# Patient Record
Sex: Male | Born: 1975
Health system: Southern US, Community
[De-identification: ages and names within clinical notes are randomized; demographics above are authoritative.]

## PROBLEM LIST (undated history)

## (undated) DIAGNOSIS — G4733 Obstructive sleep apnea (adult) (pediatric): Secondary | ICD-10-CM

## (undated) DIAGNOSIS — K219 Gastro-esophageal reflux disease without esophagitis: Secondary | ICD-10-CM

## (undated) HISTORY — PX: GALLBLADDER SURGERY: SHX652

## (undated) HISTORY — DX: Obstructive sleep apnea (adult) (pediatric): G47.33

## (undated) HISTORY — DX: Gastro-esophageal reflux disease without esophagitis: K21.9

---

## 2003-12-02 ENCOUNTER — Other Ambulatory Visit: Payer: Self-pay

## 2005-01-13 ENCOUNTER — Ambulatory Visit: Payer: Self-pay | Admitting: Family Medicine

## 2005-01-19 ENCOUNTER — Ambulatory Visit: Payer: Self-pay

## 2005-02-01 ENCOUNTER — Emergency Department: Payer: Self-pay | Admitting: General Practice

## 2005-10-05 ENCOUNTER — Emergency Department: Payer: Self-pay | Admitting: Emergency Medicine

## 2005-10-05 ENCOUNTER — Other Ambulatory Visit: Payer: Self-pay

## 2006-01-30 ENCOUNTER — Emergency Department: Payer: Self-pay | Admitting: Emergency Medicine

## 2007-01-10 ENCOUNTER — Emergency Department: Payer: Self-pay | Admitting: Emergency Medicine

## 2007-01-30 ENCOUNTER — Emergency Department (HOSPITAL_COMMUNITY): Admission: EM | Admit: 2007-01-30 | Discharge: 2007-01-31 | Payer: Self-pay | Admitting: *Deleted

## 2011-12-14 ENCOUNTER — Ambulatory Visit: Payer: Self-pay

## 2014-12-28 ENCOUNTER — Ambulatory Visit: Payer: Self-pay | Admitting: Internal Medicine

## 2015-03-13 ENCOUNTER — Emergency Department: Payer: Self-pay | Admitting: Emergency Medicine

## 2015-03-20 ENCOUNTER — Encounter
Admit: 2015-03-20 | Disposition: A | Discharge status: Home / Self Care | Payer: Self-pay | Attending: Surgery | Admitting: Surgery

## 2015-03-27 ENCOUNTER — Encounter
Admit: 2015-03-27 | Disposition: A | Discharge status: Home / Self Care | Payer: Self-pay | Attending: Surgery | Admitting: Surgery

## 2015-08-24 ENCOUNTER — Other Ambulatory Visit: Payer: Self-pay | Admitting: Family Medicine

## 2015-09-24 ENCOUNTER — Encounter: Payer: Self-pay | Admitting: Family Medicine

## 2015-09-24 ENCOUNTER — Ambulatory Visit: Payer: Self-pay | Admitting: Family Medicine

## 2015-09-24 ENCOUNTER — Ambulatory Visit (INDEPENDENT_AMBULATORY_CARE_PROVIDER_SITE_OTHER): Payer: BLUE CROSS/BLUE SHIELD | Admitting: Family Medicine

## 2015-09-24 VITALS — BP 132/78 | HR 113 | Temp 98.7°F | Resp 16 | Ht 70.0 in | Wt 277.1 lb

## 2015-09-24 DIAGNOSIS — R9431 Abnormal electrocardiogram [ECG] [EKG]: Secondary | ICD-10-CM

## 2015-09-24 DIAGNOSIS — R0683 Snoring: Secondary | ICD-10-CM

## 2015-09-24 DIAGNOSIS — R55 Syncope and collapse: Secondary | ICD-10-CM

## 2015-09-24 LAB — GLUCOSE, POCT (MANUAL RESULT ENTRY): POC Glucose: 89 mg/dl (ref 70–99)

## 2015-09-24 NOTE — Progress Notes (Signed)
Name: Nathan [name]   MRN: [id number]    DOB: 09/06/1976   Date:09/24/2015       Progress Note  Subjective  Chief Complaint  Chief Complaint  Patient presents with  . Dizziness    pt has been having dizzy spells for a while and passed out on Saturday  . Gastrophageal Reflux    Loss of Consciousness This is a new problem. The current episode started in the past 7 days. Associated symptoms include chest pain (intermittent chest discomfort attributed to 'heartburn' by the pt.), dizziness and headaches. Pertinent negatives include no abdominal pain, fever, malaise/fatigue, nausea, palpitations or vomiting.  Pt. Describes an episode of loss of consciousness last Saturday Am (5 days ago). He was staying a [place] park in a tent, got up to use the restroom, and apparently passed out and collapsed on the ground. He remembers not feeling anything unusual prior to the episode. He remembers waking up and getting upo from the ground.  Snoring Pt.'s wife, who has accompanied him to today's office visit is also concerned about snoring episodes. Pt. Has always snored but his snoring pattern has changed recently according to his wife. When he wakes up, he often tells his wife that he felt as if he didn't sleep. Past Medical History  Diagnosis Date  . GERD (gastroesophageal reflux disease)   . OSA (obstructive sleep apnea)     Past Surgical History  Procedure Laterality Date  . Gallbladder surgery      Family History  Problem Relation Age of Onset  . Cancer Mother   . Stroke Father   . Heart disease Father   . Diabetes Father     Social History   Social History  . Marital Status: Married    Spouse Name: N/A  . Number of Children: N/A  . Years of Education: N/A   Occupational History  . Not on file.   Social History Main Topics  . Smoking status: Not on file  . Smokeless tobacco: Not on file  . Alcohol Use: Not on file  . Drug Use: Not on file  . Sexual Activity: Not on file    Other Topics Concern  . Not on file   Social History Narrative  . No narrative on file     Current outpatient prescriptions:  .  meloxicam (MOBIC) 15 MG tablet, Take 15 mg by mouth daily., Disp: , Rfl:   Not on File   Review of Systems  Constitutional: Negative for fever, chills and malaise/fatigue.  HENT: Negative for ear discharge and ear pain.   Eyes: Positive for blurred vision (complains of occasional blurred vision, ). Negative for double vision.  Respiratory: Negative for cough and shortness of breath.   Cardiovascular: Positive for chest pain (intermittent chest discomfort attributed to 'heartburn' by the pt.) and syncope. Negative for palpitations and leg swelling.  Gastrointestinal: Negative for heartburn, nausea, vomiting, abdominal pain and blood in stool.  Musculoskeletal: Negative for neck pain.  Neurological: Positive for dizziness, loss of consciousness and headaches. Negative for seizures.  Psychiatric/Behavioral: Negative for depression. The patient is not nervous/anxious.    Objective  Filed Vitals:   09/24/15 1543  BP: 132/78  Pulse: 113  Temp: 98.7 F (37.1 C)  Resp: 16  Height: 5\' 10"  (1.778 m)  Weight: 277 lb 2 oz (125.703 kg)  SpO2: 97%    Physical Exam  Constitutional: He is well-developed, well-nourished, and in no distress.  HENT:  Head: Normocephalic and atraumatic.  Right  Ear: Tympanic membrane and ear canal normal.  Left Ear: Tympanic membrane and ear canal normal.  Eyes: Conjunctivae are normal. Pupils are equal, round, and reactive to light.  Neck: Normal range of motion.  Cardiovascular: Normal rate, regular rhythm and normal heart sounds.   Pulmonary/Chest: Effort normal and breath sounds normal.  Abdominal: Soft. There is no tenderness.  Neurological: He is alert. He has normal strength and intact cranial nerves.  Skin: Skin is warm and dry.  Psychiatric: Memory, affect and judgment normal.  Nursing note and vitals  reviewed.  Assessment & Plan  1. Snoring  - Ambulatory referral to Sleep Studies  2. Syncope and collapse Obtain workup including EKG, metabolic studies, complete blood count, MRI of brain, and Dopplers for evaluation of syncope. Follow-up after workup is complete. - POCT Glucose (CBG) - CBC with Differential - Comprehensive Metabolic Panel (CMET) - EKG 12-Lead - MR Brain Wo Contrast; Future - US Carotid Duplex Bilateral; Future  3. Abnormal EKG Referral to cardiology for short PR interval - Ambulatory referral to Cardiology   [facility] 09/24/2015 4:09 PM

## 2015-10-08 ENCOUNTER — Other Ambulatory Visit: Payer: Self-pay | Admitting: Family Medicine

## 2015-10-08 ENCOUNTER — Ambulatory Visit
Admission: RE | Admit: 2015-10-08 | Discharge: 2015-10-08 | Disposition: A | Discharge status: Home / Self Care | Payer: BLUE CROSS/BLUE SHIELD | Source: Ambulatory Visit | Attending: Family Medicine | Admitting: Family Medicine

## 2015-10-08 DIAGNOSIS — T1590XA Foreign body on external eye, part unspecified, unspecified eye, initial encounter: Secondary | ICD-10-CM

## 2015-10-08 DIAGNOSIS — Z87821 Personal history of retained foreign body fully removed: Secondary | ICD-10-CM | POA: Diagnosis not present

## 2015-10-09 ENCOUNTER — Ambulatory Visit
Admission: RE | Admit: 2015-10-09 | Discharge: 2015-10-09 | Disposition: A | Discharge status: Home / Self Care | Payer: BLUE CROSS/BLUE SHIELD | Source: Ambulatory Visit | Attending: Family Medicine | Admitting: Family Medicine

## 2015-10-09 DIAGNOSIS — R55 Syncope and collapse: Secondary | ICD-10-CM | POA: Insufficient documentation

## 2015-10-13 ENCOUNTER — Ambulatory Visit: Payer: BLUE CROSS/BLUE SHIELD | Admitting: Family Medicine

## 2015-11-02 ENCOUNTER — Ambulatory Visit: Payer: BLUE CROSS/BLUE SHIELD | Admitting: Family Medicine

## 2015-11-12 ENCOUNTER — Encounter: Payer: Self-pay | Admitting: Cardiovascular Disease

## 2015-11-12 ENCOUNTER — Ambulatory Visit (INDEPENDENT_AMBULATORY_CARE_PROVIDER_SITE_OTHER): Payer: BLUE CROSS/BLUE SHIELD | Admitting: Cardiovascular Disease

## 2015-11-12 VITALS — BP 147/92 | HR 99 | Ht 70.5 in | Wt 282.5 lb

## 2015-11-12 DIAGNOSIS — G4733 Obstructive sleep apnea (adult) (pediatric): Secondary | ICD-10-CM | POA: Diagnosis not present

## 2015-11-12 DIAGNOSIS — Z9989 Dependence on other enabling machines and devices: Secondary | ICD-10-CM

## 2015-11-12 DIAGNOSIS — R9431 Abnormal electrocardiogram [ECG] [EKG]: Secondary | ICD-10-CM

## 2015-11-12 DIAGNOSIS — R55 Syncope and collapse: Secondary | ICD-10-CM | POA: Diagnosis not present

## 2015-11-12 NOTE — Assessment & Plan Note (Addendum)
Etiology of his syncope is unclear. Possibly had a  orthostatic events climbing off the inflatable bed on the floor of the tent. Symptom occurred after getting off the ground to a standing position. Mild orthostasis seen on today's visit with 20 point drop in his systolic pressure Recommended fluid hydration. EKG is normal, clinical exam otherwise normal Otherwise very active at baseline. Low likelihood of ischemia given minimal intimal thickening on carotid ultrasound He does report episode of tachycardia at home unrelated to his episode We did discuss 30 day monitor. This could be done for recurrent symptoms Given normal clinical exam, symptom free, echocardiogram likely of little clinical benefit that could be done for recurrent symptoms -Unclear if sleep apnea could have been playing a role in his syncope episode. Currently now on CPAP. This was discussed with him and his wife.

## 2015-11-12 NOTE — Assessment & Plan Note (Signed)
EKG from primary care showing short PR interval This does raise the concern of accessory pathway Other EKGs 3 over the years showing no significant abnormality Recommended he call us if he has significant tachycardia episodes, event monitor could be ordered

## 2015-11-12 NOTE — Assessment & Plan Note (Signed)
Significant time spent with him discussing a regimen for weight loss He does not do any exercise, has poor diet, drinks significant volume of ice tea and large volume of potato Recommended a low carbohydrate diet

## 2015-11-12 NOTE — Assessment & Plan Note (Signed)
Tolerating CPAP the wife reports he takes this off at 4 in the morning sometimes Still trying to get used to it

## 2015-11-12 NOTE — Progress Notes (Signed)
[name], male    DOB: [birth year], 39 y.o.   MRN: [id number]  HPI Comments: Nathan [name] is a 39 year old gentleman with history of obesity, bone spurs, who presents for evaluation of abnormal EKG and syncope  Reports that approximately 78 weeks ago, he was sleeping in a [place] that [place]. Got up to go urinate at 2 in the morning, walked outside the tent and felt lightheaded, passed out, woke up on the ground. Does not feel that he really hurt himself in the fall. Does not know how long he was out but reports going back to bed, felt fine the next day. Has not had syncope in the past Declines drinking significantly the night before, did not have any GI distress, no illnesses. Nothing out of the ordinary to precipitate an event but did have urgency to go urinate. Inflatable mattress was on the ground in the tent.  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 without shortness of breath, near syncope or syncope Denies any significant chest pain  So far had MRA that showed no pathology, carotid ultrasound with minimal intimal thickening Planning to do blood work  He does report dramatic weight gain over the past year or 2 Drink significant iced tea, potatoes/fries  EKG on today's visit shows normal sinus rhythm with no significant ST or T-wave changes, rate 99 bpm EKG through primary care is relatively normal, short PR interval Prior EKG from prior years, 2006, 2004 for sensory normal   No Known Allergies    Medication List       This list is accurate as of: 11/12/15  6:00 PM.  Always use your most recent med list.               etodolac 500 MG tablet  Commonly known as:  LODINE  TAKE 1 TABLET (500 MG TOTAL) BY MOUTH 2 (TWO)  TIMES DAILY.     ranitidine 150 MG capsule  Commonly known as:  ZANTAC  Take 150 mg by mouth 2 (two) times daily.         Past Medical History  Diagnosis Date  . GERD (gastroesophageal reflux disease)   . OSA (obstructive sleep apnea)     Past Surgical History  Procedure Laterality Date  . Gallbladder surgery      Social History  reports that he has quit smoking. His smoking use included Cigarettes. He has a 16 pack-year smoking history. He does not have any smokeless tobacco history on file. He reports that he does not drink alcohol or use illicit drugs.  Family History family history includes Cancer in his mother; Diabetes in his father; Heart disease in his father; Stroke in his father.   Review of Systems  Constitutional: Negative.   Respiratory: Negative.   Cardiovascular: Positive for palpitations.  Gastrointestinal: Negative.   Musculoskeletal: Negative.   Neurological: Positive for syncope.  Hematological: Negative.   Psychiatric/Behavioral: Negative.   All other systems reviewed and are negative.   BP 147/92 mmHg  Pulse 99  Ht 5' 10.5" (1.791 m)  Wt 282 lb 8 oz (128.141 kg)  BMI 39.95 kg/m2  Physical Exam  Constitutional: He is oriented to person, place, and time. He appears well-developed and well-nourished.  Obese  HENT:  Head:  Normocephalic.  Nose: Nose normal.  Mouth/Throat: Oropharynx is clear and moist.  Eyes: Conjunctivae are normal. Pupils are equal, round, and reactive to light.  Neck: Normal range of motion. Neck supple. No JVD present.  Cardiovascular: Normal rate, regular rhythm, normal heart sounds and intact distal pulses.  Exam reveals no gallop and no friction rub.   No murmur heard. Pulmonary/Chest: Effort normal and breath sounds normal. No respiratory distress. He has no wheezes. He has no rales. He exhibits no tenderness.  Abdominal: Soft. Bowel sounds are normal. He exhibits no distension. There is no tenderness.   Musculoskeletal: Normal range of motion. He exhibits no edema or tenderness.  Lymphadenopathy:    He has no cervical adenopathy.  Neurological: He is alert and oriented to person, place, and time. Coordination normal.  Skin: Skin is warm and dry. No rash noted. No erythema.  Psychiatric: He has a normal mood and affect. His behavior is normal. Judgment and thought content normal.

## 2015-11-12 NOTE — Patient Instructions (Addendum)
You are doing well. No medication changes were made.  Please call if you have more tachycardia, fast heart rate Call if you have frequent dizzy spells, or any pass out spells  (syncope)  Please call us if you have new issues that need to be addressed before your next appt.   Tachycardia Tachycardia is a faster than normal heartbeat (more than 100 beats per minute). In adults, the heart normally beats between 60 and 100 times a minute. A fast heartbeat may be a normal response to exercise or stress. It does not necessarily mean that something is wrong. However, sometimes when your heart beats too fast it may not be able to pump enough blood to the rest of your body. This can result in chest pain, shortness of breath, dizziness, and even fainting. Nonspecific tachycardia means that the specific cause or pattern of your tachycardia is unknown. CAUSES  Tachycardia may be harmless or it may be due to a more serious underlying cause. Possible causes of tachycardia include:  Exercise or exertion.  Fever.  Pain or injury.  Infection.  Loss of body fluids (dehydration).  Overactive thyroid.  Lack of red blood cells (anemia).  Anxiety and stress.  Alcohol.  Caffeine.  Tobacco products.  Diet pills.  Illegal drugs.  Heart disease. SYMPTOMS  Rapid or irregular heartbeat (palpitations).  Suddenly feeling your heart beating (cardiac awareness).  Dizziness.  Tiredness (fatigue).  Shortness of breath.  Chest pain.  Nausea.  Fainting. DIAGNOSIS  Your caregiver will perform a physical exam and take your medical history. In some cases, a heart specialist (cardiologist) may be consulted. Your caregiver may also order:  Blood tests.  Electrocardiography. This test records the electrical activity of your heart.  A heart monitoring test. TREATMENT  Treatment will depend on the likely cause of your tachycardia. The goal is to treat the underlying cause of your tachycardia.  Treatment methods may include:  Replacement of fluids or blood through an intravenous (IV) tube for moderate to severe dehydration or anemia.  New medicines or changes in your current medicines.  Diet and lifestyle changes.  Treatment for certain infections.  Stress relief or relaxation methods. HOME CARE INSTRUCTIONS   Rest.  Drink enough fluids to keep your urine clear or pale yellow.  Do not smoke.  Avoid:  Caffeine.  Tobacco.  Alcohol.  Chocolate.  Stimulants such as over-the-counter diet pills or pills that help you stay awake.  Situations that cause anxiety or stress.  Illegal drugs such as marijuana, phencyclidine (PCP), and cocaine.  Only take medicine as directed by your caregiver.  Keep all follow-up appointments as directed by your caregiver. SEEK IMMEDIATE MEDICAL CARE IF:   You have pain in your chest, upper arms, jaw, or neck.  You become weak, dizzy, or feel faint.  You have palpitations that will not go away.  You vomit, have diarrhea, or pass blood in your stool.  Your skin is cool, pale, and wet.  You have a fever that will not go away with rest, fluids, and medicine. MAKE SURE YOU:   Understand these instructions.  Will watch your condition.  Will get help right away if you are not doing well or get worse.   This information is not intended to replace advice given to you by your health care provider. Make sure you discuss any questions you have with your health care provider.   Document Released: 01/19/2005 Document Revised: 03/05/2012 Document Reviewed: 06/26/2015 Elsevier Interactive Patient Education [employer].  Near-Syncope Near-syncope (commonly known as near fainting) is sudden weakness, dizziness, or feeling like you might pass out. During an episode of near-syncope, you may also develop pale skin, have tunnel vision, or feel sick to your stomach (nauseous). Near-syncope may occur when getting up after sitting or while  standing for a long time. It is caused by a sudden decrease in blood flow to the brain. This decrease can result from various causes or triggers, most of which are not serious. However, because near-syncope can sometimes be a sign of something serious, a medical evaluation is required. The specific cause is often not determined. HOME CARE INSTRUCTIONS  Monitor your condition for any changes. The following actions may help to alleviate any discomfort you are experiencing:  Have someone stay with you until you feel stable.  Lie down right away and prop your feet up if you start feeling like you might faint. Breathe deeply and steadily. Wait until all the symptoms have passed. Most of these episodes last only a few minutes. You may feel tired for several hours.   Drink enough fluids to keep your urine clear or pale yellow.   If you are taking blood pressure or heart medicine, get up slowly when seated or lying down. Take several minutes to sit and then stand. This can reduce dizziness.  Follow up with your health care provider as directed. SEEK IMMEDIATE MEDICAL CARE IF:   You have a severe headache.   You have unusual pain in the chest, abdomen, or back.   You are bleeding from the mouth or rectum, or you have black or tarry stool.   You have an irregular or very fast heartbeat.   You have repeated fainting or have seizure-like jerking during an episode.   You faint when sitting or lying down.   You have confusion.   You have difficulty walking.   You have severe weakness.   You have vision problems.  MAKE SURE YOU:   Understand these instructions.  Will watch your condition.  Will get help right away if you are not doing well or get worse.   This information is not intended to replace advice given to you by your health care provider. Make sure you discuss any questions you have with your health care provider.   Document Released: 12/12/2005 Document Revised:  12/17/2013 Document Reviewed: 05/17/2013 Elsevier Interactive Patient Education 2016 [employer]. Syncope Syncope is a medical term for fainting or passing out. This means you lose consciousness and drop to the ground. People are generally unconscious for less than 5 minutes. You may have some muscle twitches for up to 15 seconds before waking up and returning to normal. Syncope occurs more often in older adults, but it can happen to anyone. While most causes of syncope are not dangerous, syncope can be a sign of a serious medical problem. It is important to seek medical care.  CAUSES  Syncope is caused by a sudden drop in blood flow to the brain. The specific cause is often not determined. Factors that can bring on syncope include:  Taking medicines that lower blood pressure.  Sudden changes in posture, such as standing up quickly.  Taking more medicine than prescribed.  Standing in one place for too long.  Seizure disorders.  Dehydration and excessive exposure to heat.  Low blood sugar (hypoglycemia).  Straining to have a bowel movement.  Heart disease, irregular heartbeat, or other circulatory problems.  Fear, emotional distress, seeing blood, or severe pain. SYMPTOMS  Right before fainting, you may:  Feel dizzy or light-headed.  Feel nauseous.  See all white or all black in your field of vision.  Have cold, clammy skin. DIAGNOSIS  Your health care provider will ask about your symptoms, perform a physical exam, and perform an electrocardiogram (ECG) to record the electrical activity of your heart. Your health care provider may also perform other heart or blood tests to determine the cause of your syncope which may include:  Transthoracic echocardiogram (TTE). During echocardiography, sound waves are used to evaluate how blood flows through your heart.  Transesophageal echocardiogram (TEE).  Cardiac monitoring. This allows your health care provider to monitor your heart  rate and rhythm in real time.  Holter monitor. This is a portable device that records your heartbeat and can help diagnose heart arrhythmias. It allows your health care provider to track your heart activity for several days, if needed.  Stress tests by exercise or by giving medicine that makes the heart beat faster. TREATMENT  In most cases, no treatment is needed. Depending on the cause of your syncope, your health care provider may recommend changing or stopping some of your medicines. HOME CARE INSTRUCTIONS  Have someone stay with you until you feel stable.  Do not drive, use machinery, or play sports until your health care provider says it is okay.  Keep all follow-up appointments as directed by your health care provider.  Lie down right away if you start feeling like you might faint. Breathe deeply and steadily. Wait until all the symptoms have passed.  Drink enough fluids to keep your urine clear or pale yellow.  If you are taking blood pressure or heart medicine, get up slowly and take several minutes to sit and then stand. This can reduce dizziness. SEEK IMMEDIATE MEDICAL CARE IF:   You have a severe headache.  You have unusual pain in the chest, abdomen, or back.  You are bleeding from your mouth or rectum, or you have black or tarry stool.  You have an irregular or very fast heartbeat.  You have pain with breathing.  You have repeated fainting or seizure-like jerking during an episode.  You faint when sitting or lying down.  You have confusion.  You have trouble walking.  You have severe weakness.  You have vision problems. If you fainted, call your local emergency services (911 in [place] Do not drive yourself to the hospital.    This information is not intended to replace advice given to you by your health care provider. Make sure you discuss any questions you have with your health care provider.   Document Released: 12/12/2005 Document Revised: 04/28/2015  Document Reviewed: 02/10/2012 Elsevier Interactive Patient Education [employer].

## 2016-09-06 DIAGNOSIS — M419 Scoliosis, unspecified: Secondary | ICD-10-CM | POA: Diagnosis not present

## 2016-09-06 DIAGNOSIS — R102 Pelvic and perineal pain: Secondary | ICD-10-CM | POA: Diagnosis not present

## 2016-09-06 DIAGNOSIS — M545 Low back pain: Secondary | ICD-10-CM | POA: Diagnosis not present

## 2016-09-06 DIAGNOSIS — W108XXA Fall (on) (from) other stairs and steps, initial encounter: Secondary | ICD-10-CM | POA: Diagnosis not present

## 2016-09-06 DIAGNOSIS — W19XXXA Unspecified fall, initial encounter: Secondary | ICD-10-CM | POA: Diagnosis not present

## 2016-09-06 DIAGNOSIS — Z87891 Personal history of nicotine dependence: Secondary | ICD-10-CM | POA: Diagnosis not present

## 2016-11-23 ENCOUNTER — Ambulatory Visit
Admission: RE | Admit: 2016-11-23 | Discharge: 2016-11-23 | Disposition: A | Discharge status: Home / Self Care | Payer: BLUE CROSS/BLUE SHIELD | Source: Ambulatory Visit | Attending: Family Medicine | Admitting: Family Medicine

## 2016-11-23 ENCOUNTER — Encounter: Payer: Self-pay | Admitting: Family Medicine

## 2016-11-23 ENCOUNTER — Ambulatory Visit (INDEPENDENT_AMBULATORY_CARE_PROVIDER_SITE_OTHER): Payer: BLUE CROSS/BLUE SHIELD | Admitting: Family Medicine

## 2016-11-23 VITALS — BP 137/85 | HR 86 | Temp 98.0°F | Resp 17 | Ht 71.0 in | Wt 285.0 lb

## 2016-11-23 DIAGNOSIS — M25522 Pain in left elbow: Secondary | ICD-10-CM

## 2016-11-23 DIAGNOSIS — M25521 Pain in right elbow: Secondary | ICD-10-CM | POA: Insufficient documentation

## 2016-11-23 DIAGNOSIS — Z23 Encounter for immunization: Secondary | ICD-10-CM | POA: Diagnosis not present

## 2016-11-23 MED ORDER — TRAMADOL HCL 50 MG PO TABS
50.0000 mg | ORAL_TABLET | Freq: Two times a day (BID) | ORAL | 0 refills | Status: DC | PRN
Start: 2016-11-23 — End: 2017-09-05

## 2016-11-23 NOTE — Progress Notes (Signed)
Name: Nathan [name]   MRN: [id number]    DOB: Aug 29, 1976   Date:11/23/2016       Progress Note  Subjective  Chief Complaint  Chief Complaint  Patient presents with  . Elbow Pain    HPI  Bilateral Elbow Pain: Pt. Has pain in both elbows, present for at least 4 weeks, gradually worsening, initially was intermittent, now persistent. Pain is worse with hyperextension and flexion at the elbow joint. 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 He has not taken any medication for the symptoms.   Past Medical History:  Diagnosis Date  . GERD (gastroesophageal reflux disease)   . OSA (obstructive sleep apnea)     Past Surgical History:  Procedure Laterality Date  . GALLBLADDER SURGERY      Family History  Problem Relation Age of Onset  . Cancer Mother   . Stroke Father   . Heart disease Father   . Diabetes Father     Social History   Social History  . Marital status: Married    Spouse name: N/A  . Number of children: N/A  . Years of education: N/A   Occupational History  . Not on file.   Social History Main Topics  . Smoking status: Former Smoker    Packs/day: 1.00    Years: 16.00    Types: Cigarettes  . Smokeless tobacco: Never Used  . Alcohol use No  . Drug use: No  . Sexual activity: Not on file   Other Topics Concern  . Not on file   Social History Narrative  . No narrative on file     Current Outpatient Prescriptions:  .  etodolac (LODINE) 500 MG tablet, TAKE 1 TABLET (500 MG TOTAL) BY MOUTH 2 (TWO) TIMES DAILY., Disp: , Rfl:  .  ranitidine (ZANTAC) 150 MG capsule, Take 150 mg by mouth 2 (two) times daily., Disp: , Rfl:   No Known Allergies   Review of Systems  Constitutional: Negative for chills, fever and malaise/fatigue.  Cardiovascular: Negative for chest pain.  Gastrointestinal: Negative for abdominal pain.  Musculoskeletal: Positive for joint pain.       Objective  Vitals:   11/23/16 1339  BP: 137/85  Pulse: 86  Resp: 17  Temp: 98 F (36.7 C)  TempSrc: Oral  SpO2: 97%  Weight: 285 lb (129.3 kg)  Height: 5\' 11"  (1.803 m)    Physical Exam  Constitutional: He is oriented to person, place, and time and well-developed, well-nourished, and in no distress.  HENT:  Head: Normocephalic and atraumatic.  Musculoskeletal:       Right elbow: He exhibits normal range of motion, no swelling and no deformity. Tenderness found. Lateral epicondyle tenderness noted.       Left elbow: He exhibits normal range of motion, no swelling and no laceration. Tenderness found. Lateral epicondyle tenderness noted.  Neurological: He is alert and oriented to person, place, and time.  Psychiatric: Mood, memory, affect and judgment normal.  Nursing note and vitals reviewed.      Assessment & Plan  1. Bilateral elbow joint pain Appears to be overuse injury, versus tendinitis. Obtain x-rays of elbow. Patient is taking Lodine which is not helping with pain. Start on tramadol. - DG Elbow Complete Left; Future - DG Elbow Complete Right; Future - traMADol (ULTRAM) 50 MG tablet; Take 1 tablet (50 mg total) by mouth every 12 (twelve) hours as needed.  Dispense: 30  tablet; Refill: 0   Lynanne Delgreco Asad A. [facility] 11/23/2016 1:45 PM

## 2016-11-25 ENCOUNTER — Telehealth: Payer: Self-pay | Admitting: Family Medicine

## 2016-11-25 NOTE — Telephone Encounter (Signed)
Pt is asking for his xray results. Please call

## 2016-11-25 NOTE — Telephone Encounter (Signed)
Patient has been notified of x-ray results of left and right elbow

## 2016-12-05 ENCOUNTER — Encounter: Payer: BLUE CROSS/BLUE SHIELD | Admitting: Family Medicine

## 2017-01-05 ENCOUNTER — Telehealth: Payer: Self-pay | Admitting: Family Medicine

## 2017-01-05 NOTE — Telephone Encounter (Signed)
Pt states [facility] faxed over paperwork for his sleep machine supplies to be signed and faxed back. [facility] [name] has not received the forms back. Please advise.

## 2017-01-10 NOTE — Telephone Encounter (Signed)
Left voicemail informing patient that paperwork from [facility] has not been received, I called [facility] and spoke to [facility] and requested for paperwork to be re-faxed.

## 2017-01-11 ENCOUNTER — Encounter: Payer: BLUE CROSS/BLUE SHIELD | Admitting: Family Medicine

## 2017-01-18 ENCOUNTER — Encounter: Payer: BLUE CROSS/BLUE SHIELD | Admitting: Family Medicine

## 2017-02-07 ENCOUNTER — Encounter: Payer: BLUE CROSS/BLUE SHIELD | Admitting: Family Medicine

## 2017-09-05 ENCOUNTER — Ambulatory Visit (INDEPENDENT_AMBULATORY_CARE_PROVIDER_SITE_OTHER): Payer: BLUE CROSS/BLUE SHIELD | Admitting: Family Medicine

## 2017-09-05 ENCOUNTER — Encounter: Payer: Self-pay | Admitting: Family Medicine

## 2017-09-05 VITALS — BP 134/76 | HR 127 | Temp 98.2°F | Resp 17 | Ht 71.0 in | Wt 290.3 lb

## 2017-09-05 DIAGNOSIS — M79672 Pain in left foot: Secondary | ICD-10-CM | POA: Diagnosis not present

## 2017-09-05 DIAGNOSIS — K219 Gastro-esophageal reflux disease without esophagitis: Secondary | ICD-10-CM | POA: Diagnosis not present

## 2017-09-05 DIAGNOSIS — M79671 Pain in right foot: Secondary | ICD-10-CM

## 2017-09-05 DIAGNOSIS — Z1211 Encounter for screening for malignant neoplasm of colon: Secondary | ICD-10-CM

## 2017-09-05 DIAGNOSIS — Z0000 Encounter for general adult medical examination without abnormal findings: Secondary | ICD-10-CM

## 2017-09-05 MED ORDER — CELECOXIB 100 MG PO CAPS: 100.0000 mg | ORAL_CAPSULE | Freq: Two times a day (BID) | ORAL | 0 refills | Status: AC

## 2017-09-05 MED ORDER — PANTOPRAZOLE SODIUM 20 MG PO TBEC: 20.0000 mg | DELAYED_RELEASE_TABLET | Freq: Every day | ORAL | 0 refills | Status: DC

## 2017-09-05 NOTE — Progress Notes (Signed)
Name: Nathan [name]   MRN: [id number]    DOB: September 19, 1976   Date:09/05/2017       Progress Note  Subjective  Chief Complaint  Chief Complaint  Patient presents with  . Annual Exam    CPE    HPI  Pt. Presents for complete physical exam. He is due for colonoscopy every 3 years because of FHx of colon cancer (mother had colon cancer, passed away at age 43, 7-8 months after diagnosis).     Past Medical History:  Diagnosis Date  . GERD (gastroesophageal reflux disease)   . OSA (obstructive sleep apnea)     Past Surgical History:  Procedure Laterality Date  . GALLBLADDER SURGERY      Family History  Problem Relation Age of Onset  . Cancer Mother   . Stroke Father   . Heart disease Father   . Diabetes Father     Social History   Social History  . Marital status: Married    Spouse name: N/A  . Number of children: N/A  . Years of education: N/A   Occupational History  . Not on file.   Social History Main Topics  . Smoking status: Former Smoker    Packs/day: 1.00    Years: 16.00    Types: Cigarettes  . Smokeless tobacco: Never Used  . Alcohol use No  . Drug use: No  . Sexual activity: Not on file   Other Topics Concern  . Not on file   Social History Narrative  . No narrative on file     Current Outpatient Prescriptions:  .  etodolac (LODINE) 500 MG tablet, TAKE 1 TABLET (500 MG TOTAL) BY MOUTH 2 (TWO) TIMES DAILY., Disp: , Rfl:  .  ranitidine (ZANTAC) 150 MG capsule, Take 150 mg by mouth 2 (two) times daily., Disp: , Rfl:   No Known Allergies   Review of Systems  Constitutional: Positive for malaise/fatigue. Negative for chills and fever.  HENT: Negative for congestion, ear pain and sore throat.   Eyes: Negative for blurred vision and double vision.  Respiratory: Negative for cough, sputum production, shortness of breath and wheezing.   Cardiovascular: Negative for chest pain, palpitations and leg swelling.  Gastrointestinal: Positive for  heartburn. Negative for abdominal pain, blood in stool, constipation, diarrhea, nausea and vomiting.  Genitourinary: Negative for dysuria and hematuria.  Musculoskeletal: Positive for joint pain (feet have bone spurs, diagnosed 2 years ago). Negative for neck pain.  Neurological: Negative for dizziness and headaches.  Psychiatric/Behavioral: Negative for depression. The patient is not nervous/anxious and does not have insomnia.     Objective  Vitals:   09/05/17 1511  BP: 134/76  Pulse: (!) 127  Resp: 17  Temp: 98.2 F (36.8 C)  TempSrc: Oral  SpO2: 97%  Weight: 290 lb 4.8 oz (131.7 kg)  Height: 5\' 11"  (1.803 m)    Physical Exam  Constitutional: He is oriented to person, place, and time and well-developed, well-nourished, and in no distress.  HENT:  Head: Normocephalic and atraumatic.  Right Ear: External ear normal.  Left Ear: External ear normal.  Mouth/Throat: Oropharynx is clear and moist.  Eyes: Pupils are equal, round, and reactive to light.  Neck: Neck supple. No thyromegaly present.  Cardiovascular: Normal rate, regular rhythm and normal heart sounds.   No murmur heard. Pulmonary/Chest: Effort normal and breath sounds normal. He has no wheezes.  Abdominal: Soft. Bowel sounds are normal. There is no tenderness.  Musculoskeletal: He exhibits no edema.  Right foot: There is tenderness and swelling.       Left foot: There is tenderness and swelling.       Feet:  Neurological: He is alert and oriented to person, place, and time.  Psychiatric: Mood, memory, affect and judgment normal.  Nursing note and vitals reviewed.     Assessment & Plan  1. Annual physical exam Obtain age-appropriate laboratory screening - CBC with Differential/Platelet - COMPLETE METABOLIC PANEL WITH GFR - Lipid panel - TSH - VITAMIN D 25 Hydroxy (Vit-D Deficiency, Fractures)  2. Screening for colon cancer Referral to gastroenterology for screening colonoscopy - Ambulatory  referral to Gastroenterology  3. Gastroesophageal reflux disease, esophagitis presence not specified Change to Protonix 20 mg daily for treatment of acid reflux symptoms - pantoprazole (PROTONIX) 20 MG tablet; Take 1 tablet (20 mg total) by mouth daily.  Dispense: 90 tablet; Refill: 0  4. Foot pain, bilateral DC etodolac and started on Celebrex 100 mg twice a day, educated on the long-term cardiovascular side effects of NSAIDs, may need to be started on a different non-NSAID analgesic for pain relief. - celecoxib (CELEBREX) 100 MG capsule; Take 1 capsule (100 mg total) by mouth 2 (two) times daily.  Dispense: 180 capsule; Refill: 0   Cayla Wiegand [facility] 09/05/2017 3:29 PM

## 2017-10-06 ENCOUNTER — Other Ambulatory Visit: Payer: Self-pay

## 2017-10-16 ENCOUNTER — Encounter: Payer: Self-pay | Admitting: *Deleted

## 2017-10-16 ENCOUNTER — Other Ambulatory Visit: Payer: Self-pay

## 2017-10-16 ENCOUNTER — Telehealth: Payer: Self-pay

## 2017-10-16 DIAGNOSIS — Z800 Family history of malignant neoplasm of digestive organs: Secondary | ICD-10-CM

## 2017-10-16 MED ORDER — NA SULFATE-K SULFATE-MG SULF 17.5-3.13-1.6 GM/177ML PO SOLN: 1.0000 | ORAL | 0 refills | Status: DC

## 2017-10-16 NOTE — Telephone Encounter (Signed)
Gastroenterology Pre-Procedure Review  Request Date:  Requesting Physician: Dr.   PATIENT REVIEW QUESTIONS: The patient responded to the following health history questions as indicated:    1. Are you having any GI issues? no 2. Do you have a personal history of Polyps? yes (repeat every 3 years) 3. Do you have a family history of Colon Cancer or Polyps? yes (Mother, deceased from colon cancer) 26. Diabetes Mellitus? no 5. Joint replacements in the past 12 months?no 6. Major health problems in the past 3 months?no 7. Any artificial heart valves, MVP, or defibrillator?no    MEDICATIONS & ALLERGIES:    Patient reports the following regarding taking any anticoagulation/antiplatelet therapy:   Plavix, Coumadin, Eliquis, Xarelto, Lovenox, Pradaxa, Brilinta, or Effient? no Aspirin? no  Patient confirms/reports the following medications:  Current Outpatient Prescriptions  Medication Sig Dispense Refill  . celecoxib (CELEBREX) 100 MG capsule Take 1 capsule (100 mg total) by mouth 2 (two) times daily. 180 capsule 0  . etodolac (LODINE) 500 MG tablet   1  . pantoprazole (PROTONIX) 20 MG tablet Take 1 tablet (20 mg total) by mouth daily. 90 tablet 0   No current facility-administered medications for this visit.     Patient confirms/reports the following allergies:  No Known Allergies  No orders of the defined types were placed in this encounter.   AUTHORIZATION INFORMATION Primary Insurance: 1D#: Group #:  Secondary Insurance: 1D#: Group #:  SCHEDULE INFORMATION: Date: 10/20/17  Time: Location: [place]

## 2017-10-19 NOTE — Discharge Instructions (Signed)
General Anesthesia, Adult, Care After °These instructions provide you with information about caring for yourself after your procedure. Your health care provider may also give you more specific instructions. Your treatment has been planned according to current medical practices, but problems sometimes occur. Call your health care provider if you have any problems or questions after your procedure. °What can I expect after the procedure? °After the procedure, it is common to have: °· Vomiting. °· A sore throat. °· Mental slowness. ° °It is common to feel: °· Nauseous. °· Cold or shivery. °· Sleepy. °· Tired. °· Sore or achy, even in parts of your body where you did not have surgery. ° °Follow these instructions at home: °For at least 24 hours after the procedure: °· Do not: °? Participate in activities where you could fall or become injured. °? Drive. °? Use heavy machinery. °? Drink alcohol. °? Take sleeping pills or medicines that cause drowsiness. °? Make important decisions or sign legal documents. °? Take care of children on your own. °· Rest. °Eating and drinking °· If you vomit, drink water, juice, or soup when you can drink without vomiting. °· Drink enough fluid to keep your urine clear or pale yellow. °· Make sure you have little or no nausea before eating solid foods. °· Follow the diet recommended by your health care provider. °General instructions °· Have a responsible adult stay with you until you are awake and alert. °· Return to your normal activities as told by your health care provider. Ask your health care provider what activities are safe for you. °· Take over-the-counter and prescription medicines only as told by your health care provider. °· If you smoke, do not smoke without supervision. °· Keep all follow-up visits as told by your health care provider. This is important. °Contact a health care provider if: °· You continue to have nausea or vomiting at home, and medicines are not helpful. °· You  cannot drink fluids or start eating again. °· You cannot urinate after 8-12 hours. °· You develop a skin rash. °· You have fever. °· You have increasing redness at the site of your procedure. °Get help right away if: °· You have difficulty breathing. °· You have chest pain. °· You have unexpected bleeding. °· You feel that you are having a life-threatening or urgent problem. °This information is not intended to replace advice given to you by your health care provider. Make sure you discuss any questions you have with your health care provider. °Document Released: 03/20/2001 Document Revised: 05/16/2016 Document Reviewed: 11/26/2015 °Elsevier Interactive Patient Education © 2018 [employer] ° °

## 2017-10-20 ENCOUNTER — Ambulatory Visit: Payer: BLUE CROSS/BLUE SHIELD | Admitting: Anesthesiology

## 2017-10-20 ENCOUNTER — Encounter
Admission: RE | Disposition: A | Discharge status: Home / Self Care | Payer: Self-pay | Source: Ambulatory Visit | Attending: Gastroenterology

## 2017-10-20 ENCOUNTER — Ambulatory Visit
Admission: RE | Admit: 2017-10-20 | Discharge: 2017-10-20 | Disposition: A | Discharge status: Home / Self Care | Payer: BLUE CROSS/BLUE SHIELD | Source: Ambulatory Visit | Attending: Gastroenterology | Admitting: Gastroenterology

## 2017-10-20 DIAGNOSIS — K635 Polyp of colon: Secondary | ICD-10-CM | POA: Diagnosis not present

## 2017-10-20 DIAGNOSIS — D123 Benign neoplasm of transverse colon: Secondary | ICD-10-CM

## 2017-10-20 DIAGNOSIS — D122 Benign neoplasm of ascending colon: Secondary | ICD-10-CM | POA: Diagnosis not present

## 2017-10-20 DIAGNOSIS — Z800 Family history of malignant neoplasm of digestive organs: Secondary | ICD-10-CM

## 2017-10-20 DIAGNOSIS — Z86010 Personal history of colonic polyps: Secondary | ICD-10-CM | POA: Insufficient documentation

## 2017-10-20 DIAGNOSIS — Z79899 Other long term (current) drug therapy: Secondary | ICD-10-CM | POA: Diagnosis not present

## 2017-10-20 DIAGNOSIS — Z1211 Encounter for screening for malignant neoplasm of colon: Secondary | ICD-10-CM | POA: Diagnosis not present

## 2017-10-20 DIAGNOSIS — K219 Gastro-esophageal reflux disease without esophagitis: Secondary | ICD-10-CM | POA: Insufficient documentation

## 2017-10-20 DIAGNOSIS — R12 Heartburn: Secondary | ICD-10-CM

## 2017-10-20 DIAGNOSIS — Z87891 Personal history of nicotine dependence: Secondary | ICD-10-CM | POA: Insufficient documentation

## 2017-10-20 DIAGNOSIS — G4733 Obstructive sleep apnea (adult) (pediatric): Secondary | ICD-10-CM | POA: Insufficient documentation

## 2017-10-20 DIAGNOSIS — K621 Rectal polyp: Secondary | ICD-10-CM

## 2017-10-20 DIAGNOSIS — K640 First degree hemorrhoids: Secondary | ICD-10-CM | POA: Diagnosis not present

## 2017-10-20 HISTORY — PX: ESOPHAGOGASTRODUODENOSCOPY (EGD) WITH PROPOFOL: SHX5813

## 2017-10-20 HISTORY — PX: COLONOSCOPY WITH PROPOFOL: SHX5780

## 2017-10-20 SURGERY — COLONOSCOPY WITH PROPOFOL
Anesthesia: General | Wound class: Clean Contaminated

## 2017-10-20 MED ORDER — SODIUM CHLORIDE 0.9 % IV SOLN: INTRAVENOUS | Status: DC

## 2017-10-20 MED ORDER — LIDOCAINE HCL (CARDIAC) 20 MG/ML IV SOLN
INTRAVENOUS | Status: DC | PRN
Start: 2017-10-20 — End: 2017-10-20
  Administered 2017-10-20: 50 mg via INTRAVENOUS

## 2017-10-20 MED ORDER — PROPOFOL 10 MG/ML IV BOLUS
INTRAVENOUS | Status: DC | PRN
  Administered 2017-10-20: 100 mg via INTRAVENOUS
  Administered 2017-10-20: 50 mg via INTRAVENOUS
  Administered 2017-10-20: 40 mg via INTRAVENOUS
  Administered 2017-10-20 (×3): 50 mg via INTRAVENOUS

## 2017-10-20 MED ORDER — STERILE WATER FOR IRRIGATION IR SOLN
Status: DC | PRN
  Administered 2017-10-20: 10:00:00

## 2017-10-20 MED ORDER — LACTATED RINGERS IV SOLN
INTRAVENOUS | Status: DC
  Administered 2017-10-20: 08:00:00 via INTRAVENOUS

## 2017-10-20 MED ORDER — GLYCOPYRROLATE 0.2 MG/ML IJ SOLN
INTRAMUSCULAR | Status: DC | PRN
  Administered 2017-10-20: 0.1 mg via INTRAVENOUS

## 2017-10-20 SURGICAL SUPPLY — 35 items
BALLN DILATOR 10-12 8 (BALLOONS)
BALLN DILATOR 12-15 8 (BALLOONS)
BALLN DILATOR 15-18 8 (BALLOONS)
BALLN DILATOR CRE 0-12 8 (BALLOONS)
BALLN DILATOR ESOPH 8 10 CRE (MISCELLANEOUS) IMPLANT
BALLOON DILATOR 12-15 8 (BALLOONS) IMPLANT
BALLOON DILATOR 15-18 8 (BALLOONS) IMPLANT
BALLOON DILATOR CRE 0-12 8 (BALLOONS) IMPLANT
BLOCK BITE 60FR ADLT L/F GRN (MISCELLANEOUS) ×2 IMPLANT
CANISTER SUCT 1200ML W/VALVE (MISCELLANEOUS) ×2 IMPLANT
CLIP HMST 235XBRD CATH ROT (MISCELLANEOUS) IMPLANT
CLIP RESOLUTION 360 11X235 (MISCELLANEOUS)
FCP ESCP3.2XJMB 240X2.8X (MISCELLANEOUS)
FORCEPS BIOP RAD 4 LRG CAP 4 (CUTTING FORCEPS) ×1 IMPLANT
FORCEPS BIOP RJ4 240 W/NDL (MISCELLANEOUS)
FORCEPS ESCP3.2XJMB 240X2.8X (MISCELLANEOUS) IMPLANT
GOWN CVR UNV OPN BCK APRN NK (MISCELLANEOUS) ×2 IMPLANT
GOWN ISOL THUMB LOOP REG UNIV (MISCELLANEOUS) ×4
INJECTOR VARIJECT VIN23 (MISCELLANEOUS) IMPLANT
KIT DEFENDO VALVE AND CONN (KITS) IMPLANT
KIT ENDO PROCEDURE OLY (KITS) ×2 IMPLANT
MARKER SPOT ENDO TATTOO 5ML (MISCELLANEOUS) IMPLANT
PAD GROUND ADULT SPLIT (MISCELLANEOUS) IMPLANT
PROBE APC STR FIRE (PROBE) IMPLANT
RETRIEVER NET PLAT FOOD (MISCELLANEOUS) IMPLANT
RETRIEVER NET ROTH 2.5X230 LF (MISCELLANEOUS) IMPLANT
SNARE SHORT THROW 13M SML OVAL (MISCELLANEOUS) ×1 IMPLANT
SNARE SHORT THROW 30M LRG OVAL (MISCELLANEOUS) IMPLANT
SNARE SNG USE RND 15MM (INSTRUMENTS) IMPLANT
SPOT EX ENDOSCOPIC TATTOO (MISCELLANEOUS)
SYR INFLATION 60ML (SYRINGE) IMPLANT
TRAP ETRAP POLY (MISCELLANEOUS) ×1 IMPLANT
VARIJECT INJECTOR VIN23 (MISCELLANEOUS)
WATER STERILE IRR 250ML POUR (IV SOLUTION) ×2 IMPLANT
WIRE CRE 18-20MM 8CM F G (MISCELLANEOUS) IMPLANT

## 2017-10-20 NOTE — Transfer of Care (Signed)
Immediate Anesthesia Transfer of Care Note  Patient: Nathan [name]  Procedure(s) Performed: COLONOSCOPY WITH PROPOFOL (N/A ) ESOPHAGOGASTRODUODENOSCOPY (EGD) WITH PROPOFOL (N/A )  Patient Location: PACU  Anesthesia Type: General  Level of Consciousness: awake, alert  and patient cooperative  Airway and Oxygen Therapy: Patient Spontanous Breathing and Patient connected to supplemental oxygen  Post-op Assessment: Post-op Vital signs reviewed, Patient's Cardiovascular Status Stable, Respiratory Function Stable, Patent Airway and No signs of Nausea or vomiting  Post-op Vital Signs: Reviewed and stable  Complications: No apparent anesthesia complications

## 2017-10-20 NOTE — Op Note (Signed)
[facility] Gastroenterology Patient Name: Nathan [name] Procedure Date: 10/20/2017 9:23 AM MRN: [id number] Account #: [id number] Date of Birth: 12-21-1976 Admit Type: Outpatient Age: 41 Room: [facility] OR ROOM 01 Gender: Male Note Status: Finalized Procedure:            Colonoscopy Indications:          Family history of anal canal cancer in a first-degree                        relative Providers:            Lucilla Lame MD, MD Medicines:            Propofol per Anesthesia Complications:        No immediate complications. Estimated blood loss: None. Procedure:            Pre-Anesthesia Assessment:                       - Prior to the procedure, a History and Physical was                        performed, and patient medications and allergies were                        reviewed. The patient's tolerance of previous                        anesthesia was also reviewed. The risks and benefits of                        the procedure and the sedation options and risks were                        discussed with the patient. All questions were                        answered, and informed consent was obtained. Prior                        Anticoagulants: The patient has taken no previous                        anticoagulant or antiplatelet agents. ASA Grade                        Assessment: II - A patient with mild systemic disease.                        After reviewing the risks and benefits, the patient was                        deemed in satisfactory condition to undergo the                        procedure.                       After obtaining informed consent, the colonoscope was                        passed under  direct vision. Throughout the procedure,                        the patient's blood pressure, pulse, and oxygen                        saturations were monitored continuously. The Wittmann [id number]) was introduced  through the                        anus and advanced to the the cecum, identified by                        appendiceal orifice and ileocecal valve. The                        colonoscopy was performed without difficulty. The                        patient tolerated the procedure well. The quality of                        the bowel preparation was excellent. Findings:      The perianal and digital rectal examinations were normal.      A 3 mm polyp was found in the ascending colon. The polyp was sessile.       The polyp was removed with a cold biopsy forceps. Resection and       retrieval were complete.      A 6 mm polyp was found in the transverse colon. The polyp was sessile.       The polyp was removed with a cold snare. Resection and retrieval were       complete.      Two sessile polyps were found in the rectum. The polyps were 5 to 6 mm       in size. These polyps were removed with a cold snare. Resection and       retrieval were complete.      Non-bleeding internal hemorrhoids were found during retroflexion. The       hemorrhoids were Grade I (internal hemorrhoids that do not prolapse). Impression:           - One 3 mm polyp in the ascending colon, removed with a                        cold biopsy forceps. Resected and retrieved.                       - One 6 mm polyp in the transverse colon, removed with                        a cold snare. Resected and retrieved.                       - Two 5 to 6 mm polyps in the rectum, removed with a                        cold snare. Resected and retrieved.                       -  Non-bleeding internal hemorrhoids. Recommendation:       - Discharge patient to home.                       - Resume previous diet.                       - Continue present medications.                       - Await pathology results.                       - Repeat colonoscopy in 3 years for surveillance. Procedure Code(s):    --- Professional ---                        [id number], Colonoscopy, flexible; with removal of tumor(s),                        polyp(s), or other lesion(s) by snare technique                       45380, 88, Colonoscopy, flexible; with biopsy, single                        or multiple Diagnosis Code(s):    --- Professional ---                       Z80.0, Family history of malignant neoplasm of                        digestive organs                       D12.2, Benign neoplasm of ascending colon                       D12.3, Benign neoplasm of transverse colon (hepatic                        flexure or splenic flexure)                       K62.1, Rectal polyp CPT copyright 2016 American Medical Association. All rights reserved. The codes documented in this report are preliminary and upon coder review may  be revised to meet current compliance requirements. Lucilla Lame MD, MD 10/20/2017 9:53:34 AM This report has been signed electronically. Number of Addenda: 0 Note Initiated On: 10/20/2017 9:23 AM Scope Withdrawal Time: 0 hours 9 minutes 53 seconds  Total Procedure Duration: 0 hours 13 minutes 2 seconds       [facility]

## 2017-10-20 NOTE — Anesthesia Postprocedure Evaluation (Signed)
Anesthesia Post Note  Patient: Nathan [name]  Procedure(s) Performed: COLONOSCOPY WITH PROPOFOL (N/A ) ESOPHAGOGASTRODUODENOSCOPY (EGD) WITH PROPOFOL (N/A )  Patient location during evaluation: PACU Anesthesia Type: General Level of consciousness: awake and alert Pain management: pain level controlled Vital Signs Assessment: post-procedure vital signs reviewed and stable Respiratory status: spontaneous breathing Cardiovascular status: blood pressure returned to baseline Postop Assessment: no headache Anesthetic complications: no    Jaci Standard, III,  Necie Wilcoxson D

## 2017-10-20 NOTE — H&P (Signed)
   Lucilla Lame, MD [facility] [address], [place], [place] [address] Phone:[phone] Fax : [phone]  Primary Care Physician:  Roselee Nova, MD Primary Gastroenterologist:  Dr. Allen Norris  Pre-Procedure History & Physical: HPI:  Nathan [name] is a 41 y.o. male is here for an endoscopy and colonoscopy.   Past Medical History:  Diagnosis Date  . GERD (gastroesophageal reflux disease)   . OSA (obstructive sleep apnea)     Past Surgical History:  Procedure Laterality Date  . GALLBLADDER SURGERY      Prior to Admission medications   Medication Sig Start Date End Date Taking? Authorizing Provider  celecoxib (CELEBREX) 100 MG capsule Take 1 capsule (100 mg total) by mouth 2 (two) times daily. 09/05/17 12/04/17 Yes Roselee Nova, MD  Na Sulfate-K Sulfate-Mg Sulf (SUPREP BOWEL PREP KIT) 17.5-3.13-1.6 GM/177ML SOLN Take 1 kit by mouth as directed. 10/16/17  Yes Lucilla Lame, MD  pantoprazole (PROTONIX) 20 MG tablet Take 1 tablet (20 mg total) by mouth daily. 09/05/17 12/04/17 Yes Roselee Nova, MD  etodolac (LODINE) 500 MG tablet  08/12/17   [provider]    Allergies as of 10/16/2017  . (No Known Allergies)    Family History  Problem Relation Age of Onset  . Cancer Mother   . Stroke Father   . Heart disease Father   . Diabetes Father     Social History   Social History  . Marital status: Married    Spouse name: N/A  . Number of children: N/A  . Years of education: N/A   Occupational History  . Not on file.   Social History Main Topics  . Smoking status: Former Smoker    Packs/day: 1.00    Years: 16.00    Types: Cigarettes    Quit date: 2008  . Smokeless tobacco: Current User    Types: Snuff  . Alcohol use No  . Drug use: No  . Sexual activity: Not on file   Other Topics Concern  . Not on file   Social History Narrative  . No narrative on file    Review of Systems: See HPI, otherwise negative ROS  Physical Exam: BP 119/81    Temp 97.9 F (36.6 C)   Resp 16   Ht _0  (1.803 m)   Wt 288 lb (130.6 kg)   SpO2 97%   BMI 40.17 kg/m  General:   Alert,  pleasant and cooperative in NAD Head:  Normocephalic and atraumatic. Neck:  Supple; no masses or thyromegaly. Lungs:  Clear throughout to auscultation.    Heart:  Regular rate and rhythm. Abdomen:  Soft, nontender and nondistended. Normal bowel sounds, without guarding, and without rebound.   Neurologic:  Alert and  oriented x4;  grossly normal neurologically.  Impression/Plan: Nathan [name] is here for an endoscopy and colonoscopy to be performed for GERD and history of polyps  Risks, benefits, limitations, and alternatives regarding  endoscopy and colonoscopy have been reviewed with the patient.  Questions have been answered.  All parties agreeable.   Lucilla Lame, MD  10/20/2017, 9:17 AM

## 2017-10-20 NOTE — Anesthesia Preprocedure Evaluation (Signed)
Anesthesia Evaluation  Patient identified by MRN, date of birth, ID band Patient awake    Reviewed: Allergy & Precautions, H&P , NPO status , Patient's Chart, lab work & pertinent test results  Airway Mallampati: II  TM Distance: >3 FB Neck ROM: full    Dental no notable dental hx.    Pulmonary sleep apnea , former smoker,    Pulmonary exam normal        Cardiovascular Normal cardiovascular exam     Neuro/Psych    GI/Hepatic Neg liver ROS, Medicated,  Endo/Other  negative endocrine ROS  Renal/GU negative Renal ROS     Musculoskeletal   Abdominal   Peds  Hematology negative hematology ROS (+)   Anesthesia Other Findings   Reproductive/Obstetrics                             Anesthesia Physical Anesthesia Plan  ASA: II  Anesthesia Plan: General   Post-op Pain Management:    Induction:   PONV Risk Score and Plan:   Airway Management Planned:   Additional Equipment:   Intra-op Plan:   Post-operative Plan:   Informed Consent: I have reviewed the patients History and Physical, chart, labs and discussed the procedure including the risks, benefits and alternatives for the proposed anesthesia with the patient or authorized representative who has indicated his/her understanding and acceptance.     Plan Discussed with:   Anesthesia Plan Comments:         Anesthesia Quick Evaluation

## 2017-10-20 NOTE — Anesthesia Procedure Notes (Signed)
Date/Time: 10/20/2017 9:26 AM Performed by: Cameron Ali Pre-anesthesia Checklist: Patient identified, Emergency Drugs available, Suction available, Timeout performed and Patient being monitored Patient Re-evaluated:Patient Re-evaluated prior to induction Oxygen Delivery Method: Nasal cannula Placement Confirmation: positive ETCO2

## 2017-10-20 NOTE — Op Note (Signed)
[facility] Gastroenterology Patient Name: Nathan [name] Procedure Date: 10/20/2017 9:23 AM MRN: [id number] Account #: [id number] Date of Birth: 12-05-1976 Admit Type: Outpatient Age: 41 Room: [facility] OR ROOM 01 Gender: Male Note Status: Finalized Procedure:            Upper GI endoscopy Indications:          Heartburn Providers:            Lucilla Lame MD, MD Referring MD:         Otila Back. Manuella Ghazi (Referring MD) Medicines:            Propofol per Anesthesia Complications:        No immediate complications. Procedure:            Pre-Anesthesia Assessment:                       - Prior to the procedure, a History and Physical was                        performed, and patient medications and allergies were                        reviewed. The patient's tolerance of previous                        anesthesia was also reviewed. The risks and benefits of                        the procedure and the sedation options and risks were                        discussed with the patient. All questions were                        answered, and informed consent was obtained. Prior                        Anticoagulants: The patient has taken no previous                        anticoagulant or antiplatelet agents. ASA Grade                        Assessment: II - A patient with mild systemic disease.                        After reviewing the risks and benefits, the patient was                        deemed in satisfactory condition to undergo the                        procedure.                       After obtaining informed consent, the endoscope was                        passed under direct vision. Throughout the procedure,  the patient's blood pressure, pulse, and oxygen                        saturations were monitored continuously. The Olympus                        GIF H180J Endoscope (S#: [id number]) was introduced                        through the mouth, and  advanced to the second part of                        duodenum. The upper GI endoscopy was accomplished                        without difficulty. The patient tolerated the procedure                        well. Findings:      The examined esophagus was normal.      The stomach was normal.      The examined duodenum was normal. Impression:           - Normal esophagus.                       - Normal stomach.                       - Normal examined duodenum.                       - No specimens collected. Recommendation:       - Discharge patient to home.                       - Resume previous diet.                       - Continue present medications. Procedure Code(s):    --- Professional ---                       [phone], Esophagogastroduodenoscopy, flexible, transoral;                        diagnostic, including collection of specimen(s) by                        brushing or washing, when performed (separate procedure) Diagnosis Code(s):    --- Professional ---                       R12, Heartburn CPT copyright 2016 American Medical Association. All rights reserved. The codes documented in this report are preliminary and upon coder review may  be revised to meet current compliance requirements. Lucilla Lame MD, MD 10/20/2017 9:35:01 AM This report has been signed electronically. Number of Addenda: 0 Note Initiated On: 10/20/2017 9:23 AM      [facility]

## 2017-10-23 ENCOUNTER — Encounter: Payer: Self-pay | Admitting: Gastroenterology

## 2017-10-24 ENCOUNTER — Encounter: Payer: Self-pay | Admitting: Gastroenterology

## 2017-11-15 ENCOUNTER — Other Ambulatory Visit: Payer: Self-pay | Admitting: Family Medicine

## 2017-11-15 DIAGNOSIS — K219 Gastro-esophageal reflux disease without esophagitis: Secondary | ICD-10-CM

## 2017-12-01 ENCOUNTER — Other Ambulatory Visit: Payer: Self-pay | Admitting: Family Medicine

## 2017-12-01 DIAGNOSIS — K219 Gastro-esophageal reflux disease without esophagitis: Secondary | ICD-10-CM

## 2017-12-01 DIAGNOSIS — M79671 Pain in right foot: Secondary | ICD-10-CM

## 2017-12-01 DIAGNOSIS — M79672 Pain in left foot: Secondary | ICD-10-CM

## 2017-12-05 ENCOUNTER — Telehealth: Payer: Self-pay | Admitting: Family Medicine

## 2017-12-05 DIAGNOSIS — M79671 Pain in right foot: Secondary | ICD-10-CM

## 2017-12-05 DIAGNOSIS — M79672 Pain in left foot: Principal | ICD-10-CM

## 2017-12-05 NOTE — Telephone Encounter (Signed)
Copied from [place] [id number]. Topic: Quick Communication - Rx Refill/Question >> Dec 05, 2017  3:18 PM Scherrie Gerlach wrote: Has the patient contacted their pharmacy? Yes. Pt states this rx was denied and he doesn't know why. States Dr Manuella Ghazi put him on and he was just seen 08/2017. Pt states his feet really hurt, needs for that. Pt request refill   celecoxib (CELEBREX) 100 MG capsule  [id number] - [place], [place] [phone] (Phone) [phone] (Fax)

## 2017-12-06 DIAGNOSIS — M79672 Pain in left foot: Principal | ICD-10-CM

## 2017-12-06 DIAGNOSIS — M79671 Pain in right foot: Secondary | ICD-10-CM | POA: Insufficient documentation

## 2017-12-06 MED ORDER — CELECOXIB 100 MG PO CAPS: 100.0000 mg | ORAL_CAPSULE | Freq: Two times a day (BID) | ORAL | 0 refills | Status: DC

## 2017-12-06 NOTE — Telephone Encounter (Signed)
Prescription for Celebrex 100 mg twice a day has been sent to his pharmacy for 90 days. Please inform patient that he must not take Etodolac along with Celebrex. He should also follow-up with a foot specialist for management of feet pain

## 2017-12-06 NOTE — Telephone Encounter (Signed)
Pt stated he has not been on the Etodalac anymore due to having heart burn. Pt is notified

## 2017-12-08 ENCOUNTER — Ambulatory Visit: Payer: BLUE CROSS/BLUE SHIELD | Admitting: Family Medicine

## 2018-02-27 ENCOUNTER — Other Ambulatory Visit: Payer: Self-pay

## 2018-02-27 DIAGNOSIS — K219 Gastro-esophageal reflux disease without esophagitis: Secondary | ICD-10-CM

## 2018-02-27 MED ORDER — PANTOPRAZOLE SODIUM 20 MG PO TBEC: 20.0000 mg | DELAYED_RELEASE_TABLET | Freq: Every day | ORAL | 0 refills | Status: DC

## 2018-02-27 NOTE — Telephone Encounter (Signed)
90 day supply

## 2018-02-27 NOTE — Telephone Encounter (Signed)
Appointment please I'll allow one refill and we'll look forward to seeing him prior to next refill being due Thank you

## 2018-02-28 NOTE — Telephone Encounter (Signed)
Pt.notified

## 2018-03-13 ENCOUNTER — Other Ambulatory Visit: Payer: Self-pay

## 2018-03-13 DIAGNOSIS — M79672 Pain in left foot: Principal | ICD-10-CM

## 2018-03-13 DIAGNOSIS — M79671 Pain in right foot: Secondary | ICD-10-CM

## 2018-03-13 NOTE — Telephone Encounter (Signed)
90 day supply

## 2018-03-13 NOTE — Telephone Encounter (Signed)
Chart briefly reviewed Last visit 2018 Patient did not get labs ordered Another provider has ordered Lodine (another NSAID) Rx denied Needs an appt

## 2018-03-14 NOTE — Telephone Encounter (Signed)
Pt scheduled an appt with Nathan [name] you had no availability

## 2018-03-16 ENCOUNTER — Encounter: Payer: Self-pay | Admitting: Nurse Practitioner

## 2018-03-16 ENCOUNTER — Ambulatory Visit: Payer: BLUE CROSS/BLUE SHIELD | Admitting: Nurse Practitioner

## 2018-03-16 DIAGNOSIS — K649 Unspecified hemorrhoids: Secondary | ICD-10-CM | POA: Diagnosis not present

## 2018-03-16 DIAGNOSIS — M7750 Other enthesopathy of unspecified foot: Secondary | ICD-10-CM | POA: Insufficient documentation

## 2018-03-16 DIAGNOSIS — K219 Gastro-esophageal reflux disease without esophagitis: Secondary | ICD-10-CM | POA: Diagnosis not present

## 2018-03-16 DIAGNOSIS — Z0000 Encounter for general adult medical examination without abnormal findings: Secondary | ICD-10-CM | POA: Diagnosis not present

## 2018-03-16 MED ORDER — PANTOPRAZOLE SODIUM 20 MG PO TBEC: 20.0000 mg | DELAYED_RELEASE_TABLET | Freq: Every day | ORAL | 0 refills | Status: DC

## 2018-03-16 MED ORDER — CELECOXIB 100 MG PO CAPS: 100.0000 mg | ORAL_CAPSULE | Freq: Two times a day (BID) | ORAL | 0 refills | Status: DC

## 2018-03-16 NOTE — Patient Instructions (Addendum)
- Have your daughter check your blood pressures at home;  Set up an appointment if it remains high.   Please tell me if you recently had a heart attack, or if you or anyone in your family has or has ever had heart disease, a heart attack, or a stroke, if you smoke, and if you have or have ever had high cholesterol, high blood pressure, or diabetes AND have or have ever hadulcers or bleeding in your stomach or intestines or other bleeding disorders.  People who take nonsteroidal anti-inflammatory drugs (NSAIDs) (other than aspirin) such as Ibuprofen (Advil, Motrin), Naproxen (Aleve), celecoxib, diclofenac ect. may have a higher risk of having a heart attack or a stroke than people who do not take these medications. This risk may be higher for people who take NSAIDs for a long time. You are at higher risk if you have recently had a heart attack, or if you or anyone in your family has or has ever had heart disease, a heart attack, or a stroke, if you smoke, and if you have or have ever had high cholesterol, high blood pressure, or diabetes. Get emergency medical help right away if you experience any of the following symptoms: chest pain, shortness of breath, weakness in one part or side of the body, or slurred speech. If you experience any of the following symptoms, stop taking your NSAID and call us: severe stomach pain, heartburn, vomiting a substance that is bloody or looks like coffee grounds, blood in the stool, or black and tarry stools.   Food Choices for Gastroesophageal Reflux Disease, Adult When you have gastroesophageal reflux disease (GERD), the foods you eat and your eating habits are very important. Choosing the right foods can help ease your discomfort. What guidelines do I need to follow?  Choose fruits, vegetables, whole grains, and low-fat dairy products.  Choose low-fat meat, fish, and poultry.  Limit fats such as oils, salad dressings, butter, nuts, and avocado.  Keep a food diary.  This helps you identify foods that cause symptoms.  Avoid foods that cause symptoms. These may be different for everyone.  Eat small meals often instead of 3 large meals a day.  Eat your meals slowly, in a place where you are relaxed.  Limit fried foods.  Cook foods using methods other than frying.  Avoid drinking alcohol.  Avoid drinking large amounts of liquids with your meals.  Avoid bending over or lying down until 2-3 hours after eating. What foods are not recommended? These are some foods and drinks that may make your symptoms worse: Vegetables Tomatoes. Tomato juice. Tomato and spaghetti sauce. Chili peppers. Onion and garlic. Horseradish. Fruits Oranges, grapefruit, and lemon (fruit and juice). Meats High-fat meats, fish, and poultry. This includes hot dogs, ribs, ham, sausage, salami, and bacon. Dairy Whole milk and chocolate milk. Sour cream. Cream. Butter. Ice cream. Cream cheese. Drinks Coffee and tea. Bubbly (carbonated) drinks or energy drinks. Condiments Hot sauce. Barbecue sauce. Sweets/Desserts Chocolate and cocoa. Donuts. Peppermint and spearmint. Fats and Oils High-fat foods. This includes [place] fries and potato chips. Other Vinegar. Strong spices. This includes black pepper, white pepper, red pepper, cayenne, curry powder, cloves, ginger, and chili powder. The items listed above may not be a complete list of foods and drinks to avoid. Contact your dietitian for more information. This information is not intended to replace advice given to you by your health care provider. Make sure you discuss any questions you have with your health care provider.  Document Released: 06/12/2012 Document Revised: 05/19/2016 Document Reviewed: 10/16/2013 Elsevier Interactive Patient Education  2017 [employer].

## 2018-03-16 NOTE — Assessment & Plan Note (Signed)
Discussed diet and exercise with patient- he states he feels he is putting on significant weight over the past few months/years despite no changes in his regular routine. Discussed metabolism decreasing over time and importance of reducing calorie intake. Will draw labs for secondary causes of weight gain and fatigue.

## 2018-03-16 NOTE — Assessment & Plan Note (Signed)
Cont. Protonix, provided diet information and encouraged diet change as well as wedge for bedtime to prevent night-time reflux. Discussed long-term SE of PPI; follow up with GI PRN

## 2018-03-16 NOTE — Progress Notes (Addendum)
Name: Nathan [name]   MRN: [id number]    DOB: 1976-04-09   Date:03/16/2018       Progress Note  Subjective  Chief Complaint  Chief Complaint  Patient presents with  . Medication Refill    clebrex for bone spurs in feet and protonix for heartburn    HPI  Foot pain Patient was diagnosed with bilateral bone spurs by heels about 2-3 years ago. He has followed with podiatry in the past- who put him on an anti-inflammatory medicine that caused him signifcant heart butn so he was put on Celebrex by Dr. Manuella Ghazi 08/2017- and no issues with this medication  GERD Patient has heart burn when laying down to sleep at night. Last meal of the day is usually around 4pm and usually goes to bed at 10-11pm. Sts had an endoscopy in the past year. Pt sts symptoms are moderately controlled on the protonix. Pt sts only has occasionally has spicy food, avoids acidic and fatty foods.   Hemorrhoids Pt sts had BRB when wiping once a week, with rectal pain. Sts has seen Dr. Allen Norris in the past, does not take medications for this. Denies constipation.   Morbid Obesity Patient sts he has been gaining weight but does not understand why- he is eating the same things and activity level is the same. 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 He states he does drinks sweet tea but has had no changes to lifestyle and has been gaining significant weight in the past few months-years.   Blood pressure  Patient blood pressure elevated today- sts has had high blood pressure a long time and was supposed on blood pressure but declined and checked it for a while afterwards and it was normal. Endorses low level stress typically but the last few days have been more stressful. His daughter is a Quarry manager.   Patient Active Problem List   Diagnosis Date Noted  . Bilateral foot pain 12/06/2017  . Family history of colon cancer   . Benign neoplasm  of ascending colon   . Benign neoplasm of transverse colon   . Rectal polyp   . Heartburn   . GERD (gastroesophageal reflux disease) 09/05/2017  . Bilateral elbow joint pain 11/23/2016  . Obstructive sleep apnea on CPAP 11/12/2015  . Morbid obesity ([place]) 11/12/2015  . Syncope and collapse 09/24/2015  . Snoring 09/24/2015  . Abnormal EKG 09/24/2015    Social History   Tobacco Use  . Smoking status: Former Smoker    Packs/day: 1.00    Years: 16.00    Pack years: 16.00    Types: Cigarettes    Last attempt to quit: 2008    Years since quitting: 11.2  . Smokeless tobacco: Current User    Types: Snuff  Substance Use Topics  . Alcohol use: No     Current Outpatient Medications:  .  celecoxib (CELEBREX) 100 MG capsule, Take 1 capsule (100 mg total) by mouth 2 (two) times daily., Disp: 180 capsule, Rfl: 0 .  pantoprazole (PROTONIX) 20 MG tablet, Take 1 tablet (20 mg total) by mouth daily., Disp: 90 tablet, Rfl: 0 .  Na Sulfate-K Sulfate-Mg Sulf (SUPREP BOWEL PREP KIT) 17.5-3.13-1.6 GM/177ML SOLN, Take 1 kit by mouth as directed. (Patient not taking: Reported on 03/16/2018), Disp: 1 Bottle, Rfl: 0  No Known Allergies  ROS   Constitutional: Negative for fever Positve weight  gain and fatigue.  Respiratory: Negative for cough and shortness of breath.   Cardiovascular: Negative for chest pain or palpitations.  Gastrointestinal: Negative for abdominal pain, no bowel changes.  Musculoskeletal: Positive for foot pain. Negative for gait problem or joint swelling.  Skin: Negative for rash.  Neurological: Negative for dizziness or headache.  No other specific complaints in a complete review of systems (except as listed in HPI above).  Objective  Vitals:   03/16/18 1326  BP: 140/86  Pulse: 89  Resp: 14  Temp: 98.3 F (36.8 C)  TempSrc: Oral  SpO2: 95%  Weight: 293 lb 12.8 oz (133.3 kg)  Height: _0  (1.803 m)    Body mass index is 40.98 kg/m.  Nursing Note and Vital  Signs reviewed.  Physical Exam  Constitutional: Patient appears well-developed and well-nourished. Obese, No distress.  HEENT: head atraumatic, normocephalic, pupils equal, conjunctiva clear, neck supple without lymphadenopathy or thyromegaly, oropharynx pink and moist without exudate Cardiovascular: Normal rate, regular rhythm, S1/S2 present.  No murmur or rub heard. No BLE edema. Pulmonary/Chest: Effort normal and breath sounds clear. No respiratory distress or retractions. MSK: Steady gait, Full ROM, muscle tone appropriate, bony spurs noted to bilateral heels, tender to palpation, no redness or bruising or heat noted Psychiatric: Patient has a normal mood and affect. behavior is normal. Judgment and thought content normal.  No results found for this or any previous visit (from the past 72 hour(s)).  Assessment & Plan  Problem List Items Addressed This Visit      Cardiovascular and Mediastinum   Hemorrhoid    Discussed diet prevention of stool straining, and OTC remedies for pain relief. Continue to monitor symptoms and follow up with GI as necessary.         Digestive   GERD (gastroesophageal reflux disease)    Cont. Protonix, provided diet information and encouraged diet change as well as wedge for bedtime to prevent night-time reflux. Discussed long-term SE of PPI; follow up with GI PRN      Relevant Medications   pantoprazole (PROTONIX) 20 MG tablet     Musculoskeletal and Integument   Bone spur of foot (Chronic)    Monitor kidney function due to long-term NSAID use, cont NSAID if stable and follow-up with podiatry for further management of symptoms.       Relevant Medications   celecoxib (CELEBREX) 100 MG capsule   Other Relevant Orders   Ambulatory referral to Podiatry     Other   Morbid obesity ([place]) - Primary    Discussed diet and exercise with patient- he states he feels he is putting on significant weight over the past few months/years despite no changes in his  regular routine. Discussed metabolism decreasing over time and importance of reducing calorie intake. Will draw labs for secondary causes of weight gain and fatigue.       RESOLVED: Bilateral foot pain   Relevant Medications   celecoxib (CELEBREX) 100 MG capsule      Patients blood pressure is elevated today; He sts he refused BP medications in the past and doesn't think that his BP is usually high just today due to unusual stress and pain.He will have his daughter check his BP at home and inform us if it stays elevated.   He is concerned about payment and going to multiple specialties- discussed care with patient willing to get lab work today and go to podiatry at this time.   -Red flags and when to present for  emergency care or RTC including fever >101.87F, chest pain, shortness of breath, new/worsening/un-resolving symptoms,reviewed with patient at time of visit. Follow up and care instructions discussed and provided in AVS. -Reviewed Health Maintenance: discussed weight, need for screening labs and follow-up visit   ------------------------------------- I have reviewed this encounter including the documentation in this note and/or discussed this patient with the provider, Suezanne Cheshire DNP AGNP-C. I am certifying that I agree with the content of this note as supervising physician. Enid Derry, [facility] 03/30/2018, 5:25 PM

## 2018-03-16 NOTE — Assessment & Plan Note (Signed)
Monitor kidney function due to long-term NSAID use, cont NSAID if stable and follow-up with podiatry for further management of symptoms.

## 2018-03-16 NOTE — Assessment & Plan Note (Signed)
Discussed diet prevention of stool straining, and OTC remedies for pain relief. Continue to monitor symptoms and follow up with GI as necessary.

## 2018-03-17 LAB — CBC WITH DIFFERENTIAL/PLATELET
Basophils Absolute: 101 cells/uL (ref 0–200)
Basophils Relative: 1.3 %
EOS ABS: 328 {cells}/uL (ref 15–500)
Eosinophils Relative: 4.2 %
HCT: 47.8 % (ref 38.5–50.0)
Hemoglobin: 16.7 g/dL (ref 13.2–17.1)
Lymphs Abs: 1966 cells/uL (ref 850–3900)
MCH: 28.3 pg (ref 27.0–33.0)
MCHC: 34.9 g/dL (ref 32.0–36.0)
MCV: 80.9 fL (ref 80.0–100.0)
MONOS PCT: 6.9 %
MPV: 9.8 fL (ref 7.5–12.5)
Neutro Abs: 4867 cells/uL (ref 1500–7800)
Neutrophils Relative %: 62.4 %
PLATELETS: 318 10*3/uL (ref 140–400)
RBC: 5.91 10*6/uL — AB (ref 4.20–5.80)
RDW: 13 % (ref 11.0–15.0)
TOTAL LYMPHOCYTE: 25.2 %
WBC: 7.8 10*3/uL (ref 3.8–10.8)
WBCMIX: 538 {cells}/uL (ref 200–950)

## 2018-03-17 LAB — LIPID PANEL
CHOLESTEROL: 195 mg/dL (ref <200)
HDL: 29 mg/dL — ABNORMAL LOW (ref >40)
LDL Cholesterol (Calc): 122 mg/dL (calc) — ABNORMAL HIGH
Non-HDL Cholesterol (Calc): 166 mg/dL (calc) — ABNORMAL HIGH (ref <130)
TRIGLYCERIDES: 286 mg/dL — AB (ref <150)
Total CHOL/HDL Ratio: 6.7 (calc) — ABNORMAL HIGH (ref <5.0)

## 2018-03-17 LAB — COMPLETE METABOLIC PANEL WITH GFR
AG RATIO: 2 (calc) (ref 1.0–2.5)
ALT: 30 U/L (ref 9–46)
AST: 23 U/L (ref 10–40)
Albumin: 4.5 g/dL (ref 3.6–5.1)
Alkaline phosphatase (APISO): 61 U/L (ref 40–115)
BILIRUBIN TOTAL: 0.6 mg/dL (ref 0.2–1.2)
BUN: 12 mg/dL (ref 7–25)
CHLORIDE: 103 mmol/L (ref 98–110)
CO2: 27 mmol/L (ref 20–32)
Calcium: 9.5 mg/dL (ref 8.6–10.3)
Creat: 0.88 mg/dL (ref 0.60–1.35)
GFR, Est African American: 122 mL/min/{1.73_m2} (ref >=60)
GFR, Est Non African American: 105 mL/min/{1.73_m2} (ref >=60)
GLUCOSE: 80 mg/dL (ref 65–139)
Globulin: 2.3 g/dL (calc) (ref 1.9–3.7)
POTASSIUM: 4.2 mmol/L (ref 3.5–5.3)
Sodium: 138 mmol/L (ref 135–146)
Total Protein: 6.8 g/dL (ref 6.1–8.1)

## 2018-03-17 LAB — TSH: TSH: 2.13 mIU/L (ref 0.40–4.50)

## 2018-03-17 LAB — VITAMIN D 25 HYDROXY (VIT D DEFICIENCY, FRACTURES): Vit D, 25-Hydroxy: 20 ng/mL — ABNORMAL LOW (ref 30–100)

## 2018-05-25 ENCOUNTER — Other Ambulatory Visit: Payer: Self-pay | Admitting: Family Medicine

## 2018-05-25 ENCOUNTER — Telehealth: Payer: Self-pay | Admitting: Family Medicine

## 2018-05-25 DIAGNOSIS — K219 Gastro-esophageal reflux disease without esophagitis: Secondary | ICD-10-CM

## 2018-05-25 NOTE — Telephone Encounter (Signed)
Left voicemail with pharmacy, but pt should have refills

## 2018-05-25 NOTE — Telephone Encounter (Signed)
Copied from [place] [id number]. Topic: Quick Communication - Rx Refill/Question >> May 25, 2018  4:36 PM Percell Belt A wrote: Medication: ***  Has the patient contacted their pharmacy?  (Agent: If no, request that the patient contact the pharmacy for the refill.) (Agent: If yes, when and what did the pharmacy advise?)  Preferred Pharmacy (with phone number or street name): ***  Agent: Please be advised that RX refills may take up to 3 business days. We ask that you follow-up with your pharmacy.

## 2018-05-29 ENCOUNTER — Other Ambulatory Visit: Payer: Self-pay | Admitting: Family Medicine

## 2018-05-29 DIAGNOSIS — K219 Gastro-esophageal reflux disease without esophagitis: Secondary | ICD-10-CM

## 2018-05-29 NOTE — Telephone Encounter (Signed)
Request received for PPI Last PPI was prescribed on 03/16/18 TOO SOON Rx denied

## 2018-05-30 ENCOUNTER — Telehealth: Payer: Self-pay | Admitting: Family Medicine

## 2018-05-30 NOTE — Telephone Encounter (Signed)
Told pt that both rx was written for 90 day and should not need refill at this time.  To contact pharmacy

## 2018-05-30 NOTE — Telephone Encounter (Signed)
Copied from [place] [id number]. Topic: Quick Communication - Rx Refill/Question >> May 30, 2018  9:23 AM Lennox Solders wrote: Medication: celebrex and protonix Has the patient contacted their pharmacy? yes(Agent: If no, request that the patient contact the pharmacy for the refill.) pt would like a callback once rx has been sent (Agent: If yes, when and what did the pharmacy advise?)  Preferred Pharmacy (with phone number or street name):[facility] [place] Agent: Please be advised that RX refills may take up to 3 business days. We ask that you follow-up with your pharmacy.

## 2018-06-13 ENCOUNTER — Other Ambulatory Visit: Payer: Self-pay | Admitting: Nurse Practitioner

## 2018-06-13 DIAGNOSIS — M7750 Other enthesopathy of unspecified foot: Secondary | ICD-10-CM

## 2018-09-02 ENCOUNTER — Other Ambulatory Visit: Payer: Self-pay | Admitting: Nurse Practitioner

## 2018-09-02 DIAGNOSIS — K219 Gastro-esophageal reflux disease without esophagitis: Secondary | ICD-10-CM

## 2018-09-17 ENCOUNTER — Other Ambulatory Visit: Payer: Self-pay | Admitting: Nurse Practitioner

## 2018-09-17 DIAGNOSIS — M7750 Other enthesopathy of unspecified foot: Secondary | ICD-10-CM

## 2018-11-08 DIAGNOSIS — M7732 Calcaneal spur, left foot: Secondary | ICD-10-CM | POA: Diagnosis not present

## 2018-11-08 DIAGNOSIS — M7662 Achilles tendinitis, left leg: Secondary | ICD-10-CM | POA: Diagnosis not present

## 2018-11-08 DIAGNOSIS — M7731 Calcaneal spur, right foot: Secondary | ICD-10-CM | POA: Diagnosis not present

## 2018-11-08 DIAGNOSIS — M7661 Achilles tendinitis, right leg: Secondary | ICD-10-CM | POA: Diagnosis not present

## 2018-12-11 ENCOUNTER — Other Ambulatory Visit: Payer: Self-pay | Admitting: Family Medicine

## 2018-12-11 ENCOUNTER — Telehealth: Payer: Self-pay

## 2018-12-11 DIAGNOSIS — M7750 Other enthesopathy of unspecified foot: Secondary | ICD-10-CM

## 2018-12-11 DIAGNOSIS — M779 Enthesopathy, unspecified: Secondary | ICD-10-CM

## 2018-12-11 NOTE — Telephone Encounter (Signed)
Patient has been on this NSAID now for at least 6 months I understand it is for a bone spur Please REFER to podiatrist for evaluation and management Please schedule patient to see me to discuss his weight and cholesterol and smoking

## 2018-12-11 NOTE — Telephone Encounter (Signed)
Left detailed VM. Referral entered. CRM created.

## 2018-12-18 ENCOUNTER — Other Ambulatory Visit: Payer: Self-pay | Admitting: Nurse Practitioner

## 2018-12-18 DIAGNOSIS — K219 Gastro-esophageal reflux disease without esophagitis: Secondary | ICD-10-CM

## 2018-12-31 ENCOUNTER — Telehealth: Payer: Self-pay | Admitting: Family Medicine

## 2018-12-31 ENCOUNTER — Telehealth: Payer: Self-pay

## 2018-12-31 DIAGNOSIS — K219 Gastro-esophageal reflux disease without esophagitis: Secondary | ICD-10-CM

## 2018-12-31 NOTE — Telephone Encounter (Signed)
Pt notified of previous note and scheduled appt, GERD routed to NP [facility]

## 2018-12-31 NOTE — Telephone Encounter (Signed)
Patient ins will not cover GERD med can you send something else in?

## 2018-12-31 NOTE — Telephone Encounter (Signed)
Copied from [place] [id number]. Topic: Quick Communication - See Telephone Encounter >> Dec 31, 2018  9:46 AM Bea Graff, NT wrote: CRM for notification. See Telephone encounter for: 12/31/18. Pt requesting to speak with Dr. Sanda Klein or her nurse regarding a call that states he is a smoker and the office was wanting to a check up for this and for his weight gain. Pt states he has not been a smoker and he has been seen for his bone spurs. He is unsure if this was a mistake. He also states he is having trouble getting his heartburn medication due to his insurance.

## 2019-01-01 MED ORDER — OMEPRAZOLE 20 MG PO CPDR: 20.0000 mg | DELAYED_RELEASE_CAPSULE | Freq: Every day | ORAL | 0 refills | Status: DC

## 2019-01-01 NOTE — Telephone Encounter (Signed)
I sent a 90 day supply of [facility]eprazole to [facility]. Please have him schedule a follow-up with his primary care before 3 months for continuing medications.

## 2019-01-01 NOTE — Telephone Encounter (Signed)
I called to inform this patient that a new [facility] has [facility] but his wife stated he was not in. I told her that Omeprazole was sent in and that he should f/u in 3 months with Dr. Sanda Klein and she stated he has an appt with Dr. Sanda Klein on 01/11/2019.

## 2019-01-10 ENCOUNTER — Telehealth: Payer: Self-pay

## 2019-01-10 NOTE — Telephone Encounter (Signed)
Erroneous encounter

## 2019-01-11 ENCOUNTER — Ambulatory Visit: Payer: 59 | Admitting: Family Medicine

## 2019-01-11 ENCOUNTER — Encounter: Payer: Self-pay | Admitting: Family Medicine

## 2019-01-11 VITALS — BP 118/70 | HR 95 | Temp 98.2°F | Ht 71.0 in | Wt 290.1 lb

## 2019-01-11 DIAGNOSIS — R635 Abnormal weight gain: Secondary | ICD-10-CM

## 2019-01-11 DIAGNOSIS — E785 Hyperlipidemia, unspecified: Secondary | ICD-10-CM

## 2019-01-11 DIAGNOSIS — Z5181 Encounter for therapeutic drug level monitoring: Secondary | ICD-10-CM

## 2019-01-11 DIAGNOSIS — Z800 Family history of malignant neoplasm of digestive organs: Secondary | ICD-10-CM | POA: Diagnosis not present

## 2019-01-11 DIAGNOSIS — D485 Neoplasm of uncertain behavior of skin: Secondary | ICD-10-CM

## 2019-01-11 DIAGNOSIS — E559 Vitamin D deficiency, unspecified: Secondary | ICD-10-CM

## 2019-01-11 DIAGNOSIS — G4733 Obstructive sleep apnea (adult) (pediatric): Secondary | ICD-10-CM

## 2019-01-11 DIAGNOSIS — Z9989 Dependence on other enabling machines and devices: Secondary | ICD-10-CM

## 2019-01-11 DIAGNOSIS — R0683 Snoring: Secondary | ICD-10-CM | POA: Diagnosis not present

## 2019-01-11 DIAGNOSIS — L918 Other hypertrophic disorders of the skin: Secondary | ICD-10-CM

## 2019-01-11 NOTE — Assessment & Plan Note (Signed)
Reviewed last colonoscopy; next due October 2021

## 2019-01-11 NOTE — Assessment & Plan Note (Signed)
Encouraged CPAP use; contact [facility] and ask them to asist with mask, fitting

## 2019-01-11 NOTE — Assessment & Plan Note (Signed)
Will contact [facility] asbout mask

## 2019-01-11 NOTE — Assessment & Plan Note (Signed)
Explained morbid obesity increases sleep apnea, high cholesterol, risk of colon cancer; offered referral to nutritionist; do drink plenty of water; check tsh

## 2019-01-11 NOTE — Progress Notes (Signed)
BP 118/70   Pulse 95   Temp 98.2 F (36.8 C)   Ht 5\' 11"  (1.803 m)   Wt 290 lb 1.6 oz (131.6 kg)   SpO2 97%   BMI 40.46 kg/m    Subjective:    [name], male    DOB: 1976/07/09, 43 y.o.   MRN: [id number]  HPI: Nathan [name] is a 43 y.o. male  Chief Complaint  Patient presents with  . Follow-up    HPI Patient is here for f/u No medical excitement since last visit here  Seeing Dr. Caryl Comes for foot care; does not need the other referral  Quit smoking 13 years ago  OSA; has the machine but doesn't use it; gaining weight; no known thyroid trouble; constipation and hemorrhoids; losing hair like father; hands are dry  Abnormal cholesterol in 2019; weight gain but active Father is big man; has diabetes; father had neck stents, grandfather had heart stents Saw cardiologist and okay  Mother died from colon cancer at age 1  Depression screen [facility] 2/9 01/11/2019 03/16/2018 09/05/2017 11/23/2016 09/24/2015  Decreased Interest 0 0 0 0 0  Down, Depressed, Hopeless 0 0 0 0 0  PHQ - 2 Score 0 0 0 0 0  Altered sleeping 0 - - - -  Tired, decreased energy 0 - - - -  Change in appetite 0 - - - -  Feeling bad or failure about yourself  0 - - - -  Trouble concentrating 0 - - - -  Moving slowly or fidgety/restless 0 - - - -  Suicidal thoughts 0 - - - -  PHQ-9 Score 0 - - - -  Difficult doing work/chores Not difficult at all - - - -   Fall Risk  01/11/2019 03/16/2018 09/05/2017 11/23/2016 09/24/2015  Falls in the past year? 0 No No No No  Number falls in past yr: 1 - - - -  Injury with Fall? 0 - - - -    Relevant past medical, surgical, family and social history reviewed Past Medical History:  Diagnosis Date  . GERD (gastroesophageal reflux disease)   . OSA (obstructive sleep apnea)    Past Surgical History:  Procedure Laterality Date  . COLONOSCOPY WITH PROPOFOL N/A 10/20/2017   Procedure: COLONOSCOPY WITH PROPOFOL;  Surgeon: Lucilla Lame, MD;  Location: [facility];  Service: Endoscopy;  Laterality: N/A;  . ESOPHAGOGASTRODUODENOSCOPY (EGD) WITH PROPOFOL N/A 10/20/2017   Procedure: ESOPHAGOGASTRODUODENOSCOPY (EGD) WITH PROPOFOL;  Surgeon: Lucilla Lame, MD;  Location: [facility];  Service: Endoscopy;  Laterality: N/A;  sleep apnea  . GALLBLADDER SURGERY     Family History  Problem Relation Age of Onset  . Cancer Mother   . Stroke Father   . Heart disease Father   . Diabetes Father    Social History   Tobacco Use  . Smoking status: Former Smoker    Packs/day: 1.00    Years: 16.00    Pack years: 16.00    Types: Cigarettes    Last attempt to quit: 2008    Years since quitting: 12.0  . Smokeless tobacco: Current User    Types: Snuff, Chew  Substance Use Topics  . Alcohol use: No  . Drug use: No     [facility]  AUDIT-C Score  0      Interim medical history since last visit reviewed. Allergies and medications reviewed  Review of Systems Per HPI  unless specifically indicated above     Objective:    BP 118/70   Pulse 95   Temp 98.2 F (36.8 C)   Ht 5\' 11"  (1.803 m)   Wt 290 lb 1.6 oz (131.6 kg)   SpO2 97%   BMI 40.46 kg/m   Wt Readings from Last 3 Encounters:  01/11/19 290 lb 1.6 oz (131.6 kg)  03/16/18 293 lb 12.8 oz (133.3 kg)  10/20/17 288 lb (130.6 kg)    Physical Exam Constitutional:      General: He is not in acute distress.    Appearance: He is well-developed. He is obese.  Eyes:     General: No scleral icterus. Cardiovascular:     Rate and Rhythm: Normal rate and regular rhythm.  Pulmonary:     Effort: Pulmonary effort is normal.     Breath sounds: Normal breath sounds.  Skin:    Coloration: Skin is not pale.     Comments: Skin tags, flesh colored around the neck; macular dark brown lesion upper chest left of midline  Neurological:     Mental Status: He is alert.  Psychiatric:        Mood and Affect: Mood normal.          Assessment & Plan:   Problem List Items Addressed This Visit      Respiratory   Obstructive sleep apnea on CPAP - Primary    Encouraged CPAP use; contact [facility] and ask them to asist with mask, fitting      Relevant Orders   CBC (Completed)     Other   Snoring    Will contact [facility] asbout mask      Morbid obesity ([place])    Explained morbid obesity increases sleep apnea, high cholesterol, risk of colon cancer; offered referral to nutritionist; do drink plenty of water; check tsh      Family history of colon cancer    Reviewed last colonoscopy; next due October 2021       Other Visit Diagnoses    Skin tag       Relevant Orders   Ambulatory referral to Dermatology   Neoplasm of uncertain behavior of skin       Relevant Orders   Ambulatory referral to Dermatology   Weight gain       Relevant Orders   TSH (Completed)   T4, free (Completed)   Dyslipidemia with high LDL and low HDL       Relevant Orders   Lipid panel (Completed)   Medication monitoring encounter       Relevant Orders   COMPLETE METABOLIC PANEL WITH GFR (Completed)   Vitamin D deficiency       Relevant Orders   VITAMIN D 25 Hydroxy (Vit-D Deficiency, Fractures)       Follow up plan: Return in about 3 months (around 04/12/2019) for follow-up visit with Dr. Sanda Klein; challenge to lose 10 pounds before next visit.  An after-visit summary was printed and given to the patient at [facility].  Please see the patient instructions which may contain other information and recommendations beyond what is mentioned above in the assessment and plan.  No orders of the defined types were placed in this encounter.   Orders Placed This Encounter  Procedures  . CBC  . COMPLETE METABOLIC PANEL WITH GFR  . Lipid panel  . TSH  . T4, free  . VITAMIN D 25 Hydroxy (Vit-D Deficiency, Fractures)  . VITAMIN D 25 Hydroxy (Vit-D  Deficiency, Fractures)  . Ambulatory referral to Dermatology

## 2019-01-11 NOTE — Patient Instructions (Addendum)
You are due for your next colonoscopy in October of 2021 with Dr. Allen Norris  Check out the information at familydoctor.org entitled "Nutrition for Weight Loss: What You Need to Know about Fad Diets" Try to lose between 1-2 pounds per week by taking in fewer calories and burning off more calories You can succeed by limiting portions, limiting foods dense in calories and fat, becoming more active, and drinking 8 glasses of water a day (64 ounces) Don't skip meals, especially breakfast, as skipping meals may alter your metabolism Do not use over-the-counter weight loss pills or gimmicks that claim rapid weight loss A healthy BMI (or body mass index) is between 18.5 and 24.9 You can calculate your ideal BMI at the NIH website ClubMonetize.fr  Try to limit saturated fats in your diet (bologna, hot dogs, barbeque, cheeseburgers, hamburgers, steak, bacon, sausage, cheese, etc.) and get more fresh fruits, vegetables, and whole grains  We'll have you see the dermatologist  If you have not heard anything from my staff in a week about any orders/referrals/studies from today, please contact us here to follow-up [phone]   Obesity, Adult Obesity is the condition of having too much total body fat. Being overweight or obese means that your weight is greater than what is considered healthy for your body size. Obesity is determined by a measurement called BMI. BMI is an estimate of body fat and is calculated from height and weight. For adults, a BMI of 30 or higher is considered obese. Obesity can eventually lead to other health concerns and major illnesses, including:  Stroke.  Coronary artery disease (CAD).  Type 2 diabetes.  Some types of cancer, including cancers of the colon, breast, uterus, and gallbladder.  Osteoarthritis.  High blood pressure (hypertension).  High cholesterol.  Sleep apnea.  Gallbladder stones.  Infertility  problems. What are the causes? The main cause of obesity is taking in (consuming) more calories than your body uses for energy. Other factors that contribute to this condition may include:  Being born with genes that make you more likely to become obese.  Having a medical condition that causes obesity. These conditions include: ? Hypothyroidism. ? Polycystic ovarian syndrome (PCOS). ? Binge-eating disorder. ? Cushing syndrome.  Taking certain medicines, such as steroids, antidepressants, and seizure medicines.  Not being physically active (sedentary lifestyle).  Living where there are limited places to exercise safely or buy healthy foods.  Not getting enough sleep. What increases the risk? The following factors may increase your risk of this condition:  Having a family history of obesity.  Being a woman of African-American descent.  Being a man of Hispanic descent. What are the signs or symptoms? Having excessive body fat is the main symptom of this condition. How is this diagnosed? This condition may be diagnosed based on:  Your symptoms.  Your medical history.  A physical exam. Your health care provider may measure: ? Your BMI. If you are an adult with a BMI between 25 and less than 30, you are considered overweight. If you are an adult with a BMI of 30 or higher, you are considered obese. ? The distances around your hips and your waist (circumferences). These may be compared to each other to help diagnose your condition. ? Your skinfold thickness. Your health care provider may gently pinch a fold of your skin and measure it. How is this treated? Treatment for this condition often includes changing your lifestyle. Treatment may include some or all of the following:  Dietary changes.  Work with your health care provider and a dietitian to set a weight-loss goal that is healthy and reasonable for you. Dietary changes may include eating: ? Smaller portions. A portion size  is the amount of a particular food that is healthy for you to eat at one time. This varies from person to person. ? Low-calorie or low-fat options. ? More whole grains, fruits, and vegetables.  Regular physical activity. This may include aerobic activity (cardio) and strength training.  Medicine to help you lose weight. Your health care provider may prescribe medicine if you are unable to lose 1 pound a week after 6 weeks of eating more healthily and doing more physical activity.  Surgery. Surgical options may include gastric banding and gastric bypass. Surgery may be done if: ? Other treatments have not helped to improve your condition. ? You have a BMI of 40 or higher. ? You have life-threatening health problems related to obesity. Follow these instructions at home:  Eating and drinking   Follow recommendations from your health care provider about what you eat and drink. Your health care provider may advise you to: ? Limit fast foods, sweets, and processed snack foods. ? Choose low-fat options, such as low-fat milk instead of whole milk. ? Eat 5 or more servings of fruits or vegetables every day. ? Eat at home more often. This gives you more control over what you eat. ? Choose healthy foods when you eat out. ? Learn what a healthy portion size is. ? Keep low-fat snacks on hand. ? Avoid sugary drinks, such as soda, fruit juice, iced tea sweetened with sugar, and flavored milk. ? Eat a healthy breakfast.  Drink enough water to keep your urine clear or pale yellow.  Do not go without eating for long periods of time (do not fast) or follow a fad diet. Fasting and fad diets can be unhealthy and even dangerous. Physical Activity  Exercise regularly, as told by your health care provider. Ask your health care provider what types of exercise are safe for you and how often you should exercise.  Warm up and stretch before being active.  Cool down and stretch after being active.  Rest  between periods of activity. Lifestyle  Limit the time that you spend in front of your TV, computer, or video game system.  Find ways to reward yourself that do not involve food.  Limit alcohol intake to no more than 1 drink a day for nonpregnant women and 2 drinks a day for men. One drink equals 12 oz of beer, 5 oz of wine, or 1 oz of hard liquor. General instructions  Keep a weight loss journal to keep track of the food you eat and how much you exercise you get.  Take over-the-counter and prescription medicines only as told by your health care provider.  Take vitamins and supplements only as told by your health care provider.  Consider joining a support group. Your health care provider may be able to recommend a support group.  Keep all follow-up visits as told by your health care provider. This is important. Contact a health care provider if:  You are unable to meet your weight loss goal after 6 weeks of dietary and lifestyle changes. This information is not intended to replace advice given to you by your health care provider. Make sure you discuss any questions you have with your health care provider. Document Released: 01/19/2005 Document Revised: 05/16/2016 Document Reviewed: 09/30/2015 Elsevier Interactive Patient Education  2019 Elsevier  Inc.  Preventing Unhealthy Weight Gain, Adult Staying at a healthy weight is important to your overall health. When fat builds up in your body, you may become overweight or obese. Being overweight or obese increases your risk of developing certain health problems, such as heart disease, diabetes, sleeping problems, joint problems, and some types of cancer. Unhealthy weight gain is often the result of making unhealthy food choices or not getting enough exercise. You can make changes to your lifestyle to prevent obesity and stay as healthy as possible. What nutrition changes can be made?   Eat only as much as your body needs. To do  this: ? Pay attention to signs that you are hungry or full. Stop eating as soon as you feel full. ? If you feel hungry, try drinking water first before eating. Drink enough water so your urine is clear or pale yellow. ? Eat smaller portions. Pay attention to portion sizes when eating out. ? Look at serving sizes on food labels. Most foods contain more than one serving per container. ? Eat the recommended number of calories for your gender and activity level. For most active people, a daily total of 2,000 calories is appropriate. If you are trying to lose weight or are not very active, you may need to eat fewer calories. Talk with your health care provider or a diet and nutrition specialist (dietitian) about how many calories you need each day.  Choose healthy foods, such as: ? Fruits and vegetables. At each meal, try to fill at least half of your plate with fruits and vegetables. ? Whole grains, such as whole-wheat bread, brown rice, and quinoa. ? Lean meats, such as chicken or fish. ? Other healthy proteins, such as beans, eggs, or tofu. ? Healthy fats, such as nuts, seeds, fatty fish, and olive oil. ? Low-fat or fat-free dairy products.  Check food labels, and avoid food and drinks that: ? Are high in calories. ? Have added sugar. ? Are high in sodium. ? Have saturated fats or trans fats.  Cook foods in healthier ways, such as by baking, broiling, or grilling.  Make a meal plan for the week, and shop with a grocery list to help you stay on track with your purchases. Try to avoid going to the grocery store when you are hungry.  When grocery shopping, try to shop around the outside of the store first, where the fresh foods are. Doing this helps you to avoid prepackaged foods, which can be high in sugar, salt (sodium), and fat. What lifestyle changes can be made?   Exercise for 30 or more minutes on 5 or more days each week. Exercising may include brisk walking, yard work, biking,  running, swimming, and team sports like basketball and soccer. Ask your health care provider which exercises are safe for you.  Do muscle-strengthening activities, such as lifting weights or using resistance bands, on 2 or more days a week.  Do not use any products that contain nicotine or tobacco, such as cigarettes and e-cigarettes. If you need help quitting, ask your health care provider.  Limit alcohol intake to no more than 1 drink a day for nonpregnant women and 2 drinks a day for men. One drink equals 12 oz of beer, 5 oz of wine, or 1 oz of hard liquor.  Try to get 7-9 hours of sleep each night. What other changes can be made?  Keep a food and activity journal to keep track of: ? What you ate and  how many calories you had. Remember to count the calories in sauces, dressings, and side dishes. ? Whether you were active, and what exercises you did. ? Your calorie, weight, and activity goals.  Check your weight regularly. Track any changes. If you notice you have gained weight, make changes to your diet or activity routine.  Avoid taking weight-loss medicines or supplements. Talk to your health care provider before starting any new medicine or supplement.  Talk to your health care provider before trying any new diet or exercise plan. Why are these changes important? Eating healthy, staying active, and having healthy habits can help you to prevent obesity. Those changes also:  Help you manage stress and emotions.  Help you connect with friends and family.  Improve your self-esteem.  Improve your sleep.  Prevent long-term health problems. What can happen if changes are not made? Being obese or overweight can cause you to develop joint or bone problems, which can make it hard for you to stay active or do activities you enjoy. Being obese or overweight also puts stress on your heart and lungs and can lead to health problems like diabetes, heart disease, and some cancers. Where to  find more information Talk with your health care provider or a dietitian about healthy eating and healthy lifestyle choices. You may also find information from:  U.S. Department of Agriculture, MyPlate: FormerBoss.no  American Heart Association: www.heart.org  Centers for Disease Control and Prevention: http://www.wolf.info/ Summary  Staying at a healthy weight is important to your overall health. It helps you to prevent certain diseases and health problems, such as heart disease, diabetes, joint problems, sleep disorders, and some types of cancer.  Being obese or overweight can cause you to develop joint or bone problems, which can make it hard for you to stay active or do activities you enjoy.  You can prevent unhealthy weight gain by eating a healthy diet, exercising regularly, not smoking, limiting alcohol, and getting enough sleep.  Talk with your health care provider or a dietitian for guidance about healthy eating and healthy lifestyle choices. This information is not intended to replace advice given to you by your health care provider. Make sure you discuss any questions you have with your health care provider. Document Released: 12/13/2016 Document Revised: 09/22/2017 Document Reviewed: 01/18/2017 Elsevier Interactive Patient Education  2019 [employer].

## 2019-01-12 LAB — COMPLETE METABOLIC PANEL WITH GFR
AG RATIO: 2.1 (calc) (ref 1.0–2.5)
ALT: 32 U/L (ref 9–46)
AST: 17 U/L (ref 10–40)
Albumin: 4.4 g/dL (ref 3.6–5.1)
Alkaline phosphatase (APISO): 54 U/L (ref 40–115)
BUN: 14 mg/dL (ref 7–25)
CALCIUM: 9.4 mg/dL (ref 8.6–10.3)
CO2: 27 mmol/L (ref 20–32)
CREATININE: 0.9 mg/dL (ref 0.60–1.35)
Chloride: 103 mmol/L (ref 98–110)
GFR, EST AFRICAN AMERICAN: 120 mL/min/{1.73_m2} (ref >=60)
GFR, EST NON AFRICAN AMERICAN: 104 mL/min/{1.73_m2} (ref >=60)
GLOBULIN: 2.1 g/dL (ref 1.9–3.7)
Glucose, Bld: 86 mg/dL (ref 65–99)
POTASSIUM: 4.1 mmol/L (ref 3.5–5.3)
Sodium: 139 mmol/L (ref 135–146)
TOTAL PROTEIN: 6.5 g/dL (ref 6.1–8.1)
Total Bilirubin: 0.5 mg/dL (ref 0.2–1.2)

## 2019-01-12 LAB — TSH: TSH: 1.22 mIU/L (ref 0.40–4.50)

## 2019-01-12 LAB — T4, FREE: Free T4: 1.4 ng/dL (ref 0.8–1.8)

## 2019-01-12 LAB — LIPID PANEL
CHOL/HDL RATIO: 5.1 (calc) — AB (ref <5.0)
Cholesterol: 199 mg/dL (ref <200)
HDL: 39 mg/dL — AB (ref >40)
LDL CHOLESTEROL (CALC): 126 mg/dL — AB
NON-HDL CHOLESTEROL (CALC): 160 mg/dL — AB (ref <130)
TRIGLYCERIDES: 202 mg/dL — AB (ref <150)

## 2019-01-12 LAB — CBC
HEMATOCRIT: 47.2 % (ref 38.5–50.0)
Hemoglobin: 16.3 g/dL (ref 13.2–17.1)
MCH: 28.8 pg (ref 27.0–33.0)
MCHC: 34.5 g/dL (ref 32.0–36.0)
MCV: 83.5 fL (ref 80.0–100.0)
MPV: 10.4 fL (ref 7.5–12.5)
PLATELETS: 320 10*3/uL (ref 140–400)
RBC: 5.65 10*6/uL (ref 4.20–5.80)
RDW: 13.2 % (ref 11.0–15.0)
WBC: 12 10*3/uL — AB (ref 3.8–10.8)

## 2019-01-12 LAB — VITAMIN D 25 HYDROXY (VIT D DEFICIENCY, FRACTURES): Vit D, 25-Hydroxy: 19 ng/mL — ABNORMAL LOW (ref 30–100)

## 2019-01-14 ENCOUNTER — Other Ambulatory Visit: Payer: Self-pay | Admitting: Family Medicine

## 2019-01-14 DIAGNOSIS — M7750 Other enthesopathy of unspecified foot: Secondary | ICD-10-CM

## 2019-01-14 MED ORDER — CELECOXIB 100 MG PO CAPS: 100.0000 mg | ORAL_CAPSULE | Freq: Two times a day (BID) | ORAL | 1 refills | Status: DC | PRN

## 2019-01-14 NOTE — Telephone Encounter (Signed)
I'm not sure why the celebrex was denied I am okay with refilling this Rx sent

## 2019-01-14 NOTE — Addendum Note (Signed)
Addended by: Arnetha Courser on: 01/14/2019 06:43 PM   Modules accepted: Orders

## 2019-01-15 ENCOUNTER — Other Ambulatory Visit: Payer: Self-pay | Admitting: Family Medicine

## 2019-01-15 ENCOUNTER — Telehealth: Payer: Self-pay

## 2019-01-15 DIAGNOSIS — D72829 Elevated white blood cell count, unspecified: Secondary | ICD-10-CM

## 2019-01-15 DIAGNOSIS — E559 Vitamin D deficiency, unspecified: Secondary | ICD-10-CM | POA: Insufficient documentation

## 2019-01-15 MED ORDER — VITAMIN D (ERGOCALCIFEROL) 1.25 MG (50000 UNIT) PO CAPS: 50000.0000 [IU] | ORAL_CAPSULE | ORAL | 0 refills | Status: DC

## 2019-01-15 NOTE — Telephone Encounter (Signed)
Pt notified. Labs ordered.

## 2019-01-15 NOTE — Progress Notes (Signed)
Nathan [name], please let the patient know that his white blood cell count is a little elevated; any symptoms of infection? Let me know if yes and we'll f/u on that; smoking can cause leukemia so we'll encourage him to quit smoking; recheck CBC in one month (please ORDER) Glucose, kidney function, and liver function normal HDL is low, but much better than last time; weight loss and smoking cessation will help the HDL; low HDL is associated with heart attacks, so weight loss and smoking cessation will help reduce that risk Vitamin D is low, so I'll send in a few weeks of that (Rx); after he finishes the Rx, ask him to start 1000 iu of vitamin D3 once a day if not getting much sun exposure; if he is at the beach or outside working in the yard, he can skip the vitamin D that day

## 2019-01-15 NOTE — Telephone Encounter (Signed)
-----   Message from Arnetha Courser, MD sent at 01/15/2019  1:44 PM EST ----- Nathan [name], please let the patient know that his white blood cell count is a little elevated; any symptoms of infection? Let me know if yes and we'll f/u on that; smoking can cause leukemia so we'll encourage him to quit smoking; recheck CBC in one month (please ORDER) Glucose, kidney function, and liver function normal HDL is low, but much better than last time; weight loss and smoking cessation will help the HDL; low HDL is associated with heart attacks, so weight loss and smoking cessation will help reduce that risk Vitamin D is low, so I'll send in a few weeks of that (Rx); after he finishes the Rx, ask him to start 1000 iu of vitamin D3 once a day if not getting much sun exposure; if he is at the beach or outside working in the yard, he can skip the vitamin D that day

## 2019-02-05 ENCOUNTER — Other Ambulatory Visit: Payer: Self-pay | Admitting: Family Medicine

## 2019-02-05 NOTE — Telephone Encounter (Signed)
Pt.notified

## 2019-02-05 NOTE — Telephone Encounter (Signed)
Please see last instructions: "Vitamin D is low, so I'll send in a few weeks of that (Rx); after he finishes the Rx, ask him to start 1000 iu of vitamin D3 once a day if not getting much sun exposure; if he is at the beach or outside working in the yard, he can skip the vitamin D that day"  Rx vitamin D declined

## 2019-02-08 DIAGNOSIS — L430 Hypertrophic lichen planus: Secondary | ICD-10-CM | POA: Diagnosis not present

## 2019-02-08 DIAGNOSIS — L820 Inflamed seborrheic keratosis: Secondary | ICD-10-CM | POA: Diagnosis not present

## 2019-02-08 DIAGNOSIS — L918 Other hypertrophic disorders of the skin: Secondary | ICD-10-CM | POA: Diagnosis not present

## 2019-02-08 DIAGNOSIS — B078 Other viral warts: Secondary | ICD-10-CM | POA: Diagnosis not present

## 2019-02-08 DIAGNOSIS — D485 Neoplasm of uncertain behavior of skin: Secondary | ICD-10-CM | POA: Diagnosis not present

## 2019-02-15 ENCOUNTER — Other Ambulatory Visit: Payer: Self-pay

## 2019-02-15 DIAGNOSIS — D72829 Elevated white blood cell count, unspecified: Secondary | ICD-10-CM

## 2019-02-15 DIAGNOSIS — E559 Vitamin D deficiency, unspecified: Secondary | ICD-10-CM

## 2019-02-15 LAB — CBC WITH DIFFERENTIAL/PLATELET
ABSOLUTE MONOCYTES: 413 {cells}/uL (ref 200–950)
BASOS ABS: 57 {cells}/uL (ref 0–200)
BASOS PCT: 0.7 %
EOS ABS: 340 {cells}/uL (ref 15–500)
Eosinophils Relative: 4.2 %
HEMATOCRIT: 43.7 % (ref 38.5–50.0)
HEMOGLOBIN: 15 g/dL (ref 13.2–17.1)
Lymphs Abs: 1555 cells/uL (ref 850–3900)
MCH: 28.7 pg (ref 27.0–33.0)
MCHC: 34.3 g/dL (ref 32.0–36.0)
MCV: 83.7 fL (ref 80.0–100.0)
MONOS PCT: 5.1 %
MPV: 10.4 fL (ref 7.5–12.5)
Neutro Abs: 5735 cells/uL (ref 1500–7800)
Neutrophils Relative %: 70.8 %
Platelets: 291 10*3/uL (ref 140–400)
RBC: 5.22 10*6/uL (ref 4.20–5.80)
RDW: 13 % (ref 11.0–15.0)
TOTAL LYMPHOCYTE: 19.2 %
WBC: 8.1 10*3/uL (ref 3.8–10.8)

## 2019-02-17 NOTE — Progress Notes (Signed)
Nathan [name], please let the patient know that we're so pleased to see that his white blood count has gone back to normal; his CBC is all normal; thank you

## 2019-03-25 ENCOUNTER — Other Ambulatory Visit: Payer: Self-pay | Admitting: Nurse Practitioner

## 2019-04-10 ENCOUNTER — Other Ambulatory Visit: Payer: Self-pay | Admitting: Family Medicine

## 2019-04-10 DIAGNOSIS — M7750 Other enthesopathy of unspecified foot: Secondary | ICD-10-CM

## 2019-04-12 ENCOUNTER — Ambulatory Visit: Payer: 59 | Admitting: Family Medicine

## 2019-04-19 ENCOUNTER — Ambulatory Visit: Payer: 59 | Admitting: Family Medicine

## 2019-06-20 ENCOUNTER — Other Ambulatory Visit: Payer: Self-pay | Admitting: Nurse Practitioner

## 2019-08-09 ENCOUNTER — Ambulatory Visit: Payer: 59 | Admitting: Family Medicine

## 2019-08-22 ENCOUNTER — Other Ambulatory Visit: Payer: Self-pay | Admitting: Nurse Practitioner

## 2019-12-13 ENCOUNTER — Other Ambulatory Visit: Payer: Self-pay

## 2019-12-13 MED ORDER — OMEPRAZOLE 20 MG PO CPDR: DELAYED_RELEASE_CAPSULE | ORAL | 1 refills | Status: DC

## 2019-12-26 ENCOUNTER — Ambulatory Visit: Payer: 59 | Admitting: Family Medicine

## 2019-12-26 ENCOUNTER — Other Ambulatory Visit: Payer: Self-pay

## 2019-12-26 ENCOUNTER — Encounter: Payer: Self-pay | Admitting: Family Medicine

## 2019-12-26 VITALS — BP 130/72 | HR 79 | Temp 97.1°F | Resp 18 | Ht 71.0 in | Wt 295.1 lb

## 2019-12-26 DIAGNOSIS — K219 Gastro-esophageal reflux disease without esophagitis: Secondary | ICD-10-CM | POA: Diagnosis not present

## 2019-12-26 DIAGNOSIS — M25552 Pain in left hip: Secondary | ICD-10-CM

## 2019-12-26 DIAGNOSIS — G5712 Meralgia paresthetica, left lower limb: Secondary | ICD-10-CM

## 2019-12-26 DIAGNOSIS — M7750 Other enthesopathy of unspecified foot: Secondary | ICD-10-CM | POA: Diagnosis not present

## 2019-12-26 MED ORDER — OMEPRAZOLE 20 MG PO CPDR: DELAYED_RELEASE_CAPSULE | ORAL | 1 refills | Status: DC

## 2019-12-26 MED ORDER — GABAPENTIN 300 MG PO CAPS: 300.0000 mg | ORAL_CAPSULE | Freq: Three times a day (TID) | ORAL | 3 refills | Status: DC

## 2019-12-26 MED ORDER — GABAPENTIN 300 MG PO CAPS: 300.0000 mg | ORAL_CAPSULE | Freq: Every day | ORAL | 1 refills | Status: DC

## 2019-12-26 MED ORDER — CELECOXIB 100 MG PO CAPS: 100.0000 mg | ORAL_CAPSULE | Freq: Two times a day (BID) | ORAL | 1 refills | Status: DC | PRN

## 2019-12-26 NOTE — Progress Notes (Signed)
Name: Nathan [name]   MRN: [id number]    DOB: Dec 19, 1976   Date:12/26/2019       Progress Note  Subjective  Chief Complaint  Chief Complaint  Patient presents with  . Groin Pain    left pain radiating  . Leg Pain  . Medication Refill    HPI  Pt presents with concern for left groin pain x2-3 days - describes pain as sharp and occurs randomly - certain motions sometimes exacerbate, but bearing weight does not seem to make a difference.  He states that he has also been struggling with left hip/thigh pain that he describes as burning pain and occurs with certain position changes and with sleeping on the left side (rubs the lateral aspect of the left high in circular motion when describing). No history of hip injury or overuse history.  He is an Clinical biochemist.  He takes celebrex BID for chronic foot pain.  Denies testicular pain, penile pain, hernia or enlarged lymph nodes.   Patient Active Problem List   Diagnosis Date Noted  . Vitamin D deficiency 01/15/2019  . Bone spur of foot 03/16/2018  . Hemorrhoid 03/16/2018  . Family history of colon cancer   . Benign neoplasm of ascending colon   . Benign neoplasm of transverse colon   . Rectal polyp   . Heartburn   . GERD (gastroesophageal reflux disease) 09/05/2017  . Bilateral elbow joint pain 11/23/2016  . Obstructive sleep apnea on CPAP 11/12/2015  . Morbid obesity ([place]) 11/12/2015  . Syncope and collapse 09/24/2015  . Snoring 09/24/2015  . Abnormal EKG 09/24/2015    Social History   Tobacco Use  . Smoking status: Former Smoker    Packs/day: 1.00    Years: 16.00    Pack years: 16.00    Types: Cigarettes    Quit date: 2008    Years since quitting: 13.0  . Smokeless tobacco: Current User    Types: Snuff, Chew  Substance Use Topics  . Alcohol use: No     Current Outpatient Medications:  .  celecoxib (CELEBREX) 100 MG capsule, TAKE 1 CAPSULE (100 MG TOTAL) BY MOUTH 2 (TWO) TIMES DAILY AS NEEDED., Disp: 180 capsule, Rfl:  1 .  omeprazole (PRILOSEC) 20 MG capsule, TAKE 1 CAPSULE BY MOUTH EVERY DAY, Disp: 90 capsule, Rfl: 1  No Known Allergies  I personally reviewed active problem list, medication list, allergies, notes from last encounter, lab results with the patient/caregiver today.  ROS  Constitutional: Negative for fever or weight change.  Respiratory: Negative for cough and shortness of breath.   Cardiovascular: Negative for chest pain or palpitations.  Gastrointestinal: Negative for abdominal pain, no bowel changes.  Musculoskeletal: Negative for gait problem or joint swelling.  Skin: Negative for rash.  Neurological: Negative for dizziness or headache. No limb weakness or giving out. No other specific complaints in a complete review of systems (except as listed in HPI above).  Objective  Vitals:   12/26/19 0833  BP: 130/72  Pulse: 79  Resp: 18  Temp: (!) 97.1 F (36.2 C)  TempSrc: Temporal  SpO2: 99%  Weight: 295 lb 1.6 oz (133.9 kg)  Height: 5\' 11"  (1.803 m)   Body mass index is 41.16 kg/m.  Nursing Note and Vital Signs reviewed.  Physical Exam  Constitutional: Patient appears well-developed and well-nourished. No distress.  HENT: Head: Normocephalic and atraumatic. Neck: Normal range of motion. Neck supple. No JVD present.  Cardiovascular: Normal rate, regular rhythm and normal heart sounds.  No murmur heard. No BLE edema. Pulmonary/Chest: Effort normal and breath sounds normal. No respiratory distress. Musculoskeletal: Normal range of motion, no joint effusions. No gross deformities.  There is pain with flexion against resistance and extension against resistance of the LEFT hip only; RIGHT hip WNL.  There is no appreciable hernia or lymphadenopathy to the bilateral inguinal regions.  Testicles WNL. Neurological: Pt is alert and oriented to person, place, and time. No cranial nerve deficit. Coordination, balance, strength, speech and gait are normal.  Skin: Skin is warm and dry.  No rash noted. No erythema.  Psychiatric: Patient has a normal mood and affect. behavior is normal. Judgment and thought content normal.  No results found for this or any previous visit (from the past 72 hour(s)).  Assessment & Plan  1. Left hip pain - Continue Celebrex - Ambulatory referral to Orthopedics  2. Bone spur of foot - celecoxib (CELEBREX) 100 MG capsule; Take 1 capsule (100 mg total) by mouth 2 (two) times daily as needed.  Dispense: 180 capsule; Refill: 1  3. Gastroesophageal reflux disease without esophagitis - omeprazole (PRILOSEC) 20 MG capsule; TAKE 1 CAPSULE BY MOUTH EVERY DAY  Dispense: 90 capsule; Refill: 1  4. Meralgia paresthetica of left side - Ambulatory referral to Orthopedics - gabapentin (NEURONTIN) 300 MG capsule; Take 1 capsule (300 mg total) by mouth QHS.  Dispense: 90 capsule; Refill: 1  -Red flags and when to present for emergency care or RTC including fever >101.54F, chest pain, shortness of breath, new/worsening/un-resolving symptoms, weakness, saddle anesthesia reviewed with patient at time of visit. Follow up and care instructions discussed and provided in AVS.

## 2020-03-01 ENCOUNTER — Ambulatory Visit
Admission: EM | Admit: 2020-03-01 | Discharge: 2020-03-01 | Disposition: A | Discharge status: Home / Self Care | Payer: 59 | Attending: Urgent Care | Admitting: Urgent Care

## 2020-03-01 ENCOUNTER — Ambulatory Visit (INDEPENDENT_AMBULATORY_CARE_PROVIDER_SITE_OTHER): Payer: 59

## 2020-03-01 ENCOUNTER — Other Ambulatory Visit: Payer: Self-pay

## 2020-03-01 ENCOUNTER — Encounter: Payer: Self-pay | Admitting: Emergency Medicine

## 2020-03-01 DIAGNOSIS — M7731 Calcaneal spur, right foot: Secondary | ICD-10-CM

## 2020-03-01 DIAGNOSIS — M79671 Pain in right foot: Secondary | ICD-10-CM | POA: Diagnosis not present

## 2020-03-01 DIAGNOSIS — M7751 Other enthesopathy of right foot: Secondary | ICD-10-CM | POA: Diagnosis not present

## 2020-03-01 DIAGNOSIS — M25571 Pain in right ankle and joints of right foot: Secondary | ICD-10-CM | POA: Diagnosis not present

## 2020-03-01 DIAGNOSIS — M7730 Calcaneal spur, unspecified foot: Secondary | ICD-10-CM

## 2020-03-01 MED ORDER — METHYLPREDNISOLONE 4 MG PO TBPK: ORAL_TABLET | ORAL | 0 refills | Status: DC

## 2020-03-01 MED ORDER — TRAMADOL HCL 50 MG PO TABS: 50.0000 mg | ORAL_TABLET | Freq: Three times a day (TID) | ORAL | 0 refills | Status: DC | PRN

## 2020-03-01 NOTE — Discharge Instructions (Addendum)
It was very nice seeing you today in clinic. Thank you for entrusting me with your care.   Rest, ICE, and elevate your ankle. Wear compression wrap for support. Use steroids to help with inflammation. May add in Tramadol for more severe pain (no driving; can make you sleepy).   Make arrangements to follow up with your regular doctor in 1 week for re-evaluation if not improving. If your symptoms/condition worsens, please seek follow up care either here or in the ER. Please remember, our [place] providers are "right here with you" when you need [place].   Again, it was my pleasure to take care of you today. Thank you for choosing our clinic. I hope that you start to feel better quickly.   Honor Loh, MSN, APRN, FNP-C, CEN Advanced Practice Provider [facility]

## 2020-03-01 NOTE — ED Triage Notes (Signed)
Pt c/o right ankle pain. Started last night. He denies injury. He states he can bare weight but it is painful.

## 2020-03-01 NOTE — ED Provider Notes (Signed)
[place], [place]   Name: Nathan [name] DOB: July 02, 1976 MRN: [id number] CSN: [id number] PCP: Arnetha Courser, MD  Arrival date and time:  03/01/20 [id number]  Chief Complaint:  Ankle Pain   NOTE: Prior to seeing the patient today, I have reviewed the triage nursing documentation and vital signs. Clinical staff has updated patient's PMH/PSHx, current medication list, and drug allergies/intolerances to ensure comprehensive history available to assist in medical decision making.   History:   HPI: Nathan [name] is a 44 y.o. male who presents today with complaints of atraumatic pain in his RIGHT ankle that started with acute onset yesterday. Patient reports that he was working and pain suddenly developed in the anterolateral aspect of his ankle. Patient on daily NSAID therapy using celecoxib. Despite use of this medication, patient notes that his pain has persisted throughout the night and is a bit worse this morning. Nathan [name] is rated 8/10 in severity. Patient reports that pain is improved when he alters his gait to walk on the lateral aspect of his foot. Pain markedly increased when he ambulates with his foot flat on the ground. Patient with known calcaneal spurs. He denies previous injuries or surgical interventions to his foot or ankle.   Past Medical History:  Diagnosis Date  . GERD (gastroesophageal reflux disease)   . OSA (obstructive sleep apnea)     Past Surgical History:  Procedure Laterality Date  . COLONOSCOPY WITH PROPOFOL N/A 10/20/2017   Procedure: COLONOSCOPY WITH PROPOFOL;  Surgeon: Lucilla Lame, MD;  Location: [facility];  Service: Endoscopy;  Laterality: N/A;  . ESOPHAGOGASTRODUODENOSCOPY (EGD) WITH PROPOFOL N/A 10/20/2017   Procedure: ESOPHAGOGASTRODUODENOSCOPY (EGD) WITH PROPOFOL;  Surgeon: Lucilla Lame, MD;  Location: [facility];  Service: Endoscopy;  Laterality: N/A;  sleep apnea  . GALLBLADDER SURGERY      Family History  Problem Relation Age of Onset  . Cancer  Mother   . Stroke Father   . Heart disease Father   . Diabetes Father     Social History   Tobacco Use  . Smoking status: Former Smoker    Packs/day: 1.00    Years: 16.00    Pack years: 16.00    Types: Cigarettes    Quit date: 2008    Years since quitting: 13.1  . Smokeless tobacco: Current User    Types: Snuff, Chew  Substance Use Topics  . Alcohol use: No  . Drug use: No    Patient Active Problem List   Diagnosis Date Noted  . Vitamin D deficiency 01/15/2019  . Bone spur of foot 03/16/2018  . Hemorrhoid 03/16/2018  . Family history of colon cancer   . Benign neoplasm of ascending colon   . Benign neoplasm of transverse colon   . Rectal polyp   . Heartburn   . GERD (gastroesophageal reflux disease) 09/05/2017  . Bilateral elbow joint pain 11/23/2016  . Obstructive sleep apnea on CPAP 11/12/2015  . Morbid obesity ([place]) 11/12/2015  . Syncope and collapse 09/24/2015  . Snoring 09/24/2015  . Abnormal EKG 09/24/2015    Home Medications:    Current Meds  Medication Sig  . omeprazole (PRILOSEC) 20 MG capsule TAKE 1 CAPSULE BY MOUTH EVERY DAY    Allergies:   Patient has no known allergies.  Review of Systems (ROS):  Review of systems NEGATIVE unless otherwise noted in narrative H&P section.   Vital Signs: Today's Vitals   03/01/20 0954 03/01/20 0957  BP:  (!) 131/95  Pulse:  88  Resp:  18  Temp:  98 F (36.7 C)  TempSrc:  Oral  SpO2:  98%  Weight: 291 lb (132 kg)   Height: 5' 10.5" (1.791 m)   PainSc: 8      Physical Exam: Physical Exam  Constitutional: He is oriented to person, place, and time and well-developed, well-nourished, and in no distress.  HENT:  Head: Normocephalic and atraumatic.  Eyes: Pupils are equal, round, and reactive to light.  Cardiovascular: Normal rate, regular rhythm, normal heart sounds and intact distal pulses.  Pulmonary/Chest: Effort normal and breath sounds normal.  Musculoskeletal:     Right ankle: No swelling,  deformity, ecchymosis or lacerations. Tenderness present over the lateral malleolus. Normal range of motion.       Feet:  Neurological: He is alert and oriented to person, place, and time. He has normal sensation, normal strength and normal reflexes. Gait (2/2 acute pain in lateral aspect of RIGHT ankle) abnormal.  Skin: Skin is warm and dry. No rash noted. He is not diaphoretic.  Psychiatric: Mood, memory, affect and judgment normal.  Nursing note and vitals reviewed.   Urgent Care Treatments / Results:   Orders Placed This Encounter  Procedures  . DG Ankle Complete Right  . Apply ace wrap    LABS: PLEASE NOTE: all labs that were ordered this encounter are listed, however only abnormal results are displayed. Labs Reviewed - No data to display  EKG: -None  RADIOLOGY: DG Ankle Complete Right  Result Date: 03/01/2020 CLINICAL DATA:  44 year old male with history of pain in the lateral right ankle. No history of trauma. EXAM: RIGHT ANKLE - COMPLETE 3+ VIEW COMPARISON:  No priors. FINDINGS: There is no evidence of fracture, dislocation, or joint effusion. There is no evidence of arthropathy or other focal bone abnormality. Soft tissues are unremarkable. IMPRESSION: Negative. Electronically Signed   By: Vinnie Langton M.D.   On: 03/01/2020 10:21    PROCEDURES: Procedures  MEDICATIONS RECEIVED THIS VISIT: Medications - No data to display  PERTINENT CLINICAL COURSE NOTES/UPDATES:   Initial Impression / Assessment and Plan / Urgent Care Course:  Pertinent labs & imaging results that were available during my care of the patient were personally reviewed by me and considered in my medical decision making (see lab/imaging section of note for values and interpretations).  Nathan [name] is a 44 y.o. male who presents to [facility] today with complaints of Ankle Pain  Patient is well appearing overall in clinic today. He does not appear to be in any acute distress. Presenting  symptoms (see HPI) and exam as documented above. Pain began yesterday with acute onset. There was no known injury. 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 Patient is on daily celecoxib. Diagnostic radiographs of the RIGHT revealed no acute abnormalities; no fracture, dislocation, or effusion. Significant enthesophytes noted on independent review; radiologist did not comment. Again, this is a known finding and patient notes that he has them in both feet.  Will cover with systemic steroid course (Medrol). Patient to continue NSAIDs as previously prescribed. Will provide a short course of Tramadol for more severe pain. Patient placed in a compression wrap for stability and comfort. Recommended rest, ice, and elevation of foot and ankle. These interventions are likely a temporary solution to a more significant issue. If not improving, patient to follow up with his PCP to discuss referral to podiatry.  Current clinical condition warrants patient being out of  work in order to recover from his current injury/illness. He was provided with the appropriate documentation to provide to his place of employment that will allow for him to RTW on 03/04/2020 with no restrictions.   Discussed follow up with primary care physician in 1 week for re-evaluation. I have reviewed the follow up and strict return precautions for any new or worsening symptoms. Patient is aware of symptoms that would be deemed urgent/emergent, and would thus require further evaluation either here or in the emergency department. At the time of discharge, he verbalized understanding and consent with the discharge plan as it was reviewed with him. All questions were fielded by provider and/or clinic staff prior to patient discharge.    Final Clinical Impressions / Urgent Care Diagnoses:   Final diagnoses:  Tendonitis of ankle, right  Acute pain of right foot  Calcaneal spur, unspecified  laterality    New Prescriptions:  [place] Controlled Substance Registry consulted? Yes, I have consulted the [place] Controlled Substances Registry for this patient, and feel the risk/benefit ratio today is favorable for proceeding with this prescription for a controlled substance.  . Discussed use of controlled substance medication to treat his acute pain.  o Reviewed [place] STOP Act regulations  o Clinic does not refill controlled substances over the phone without face to face evaluation.  . Safety precautions reviewed.  o Medications should not be bitten, chewed, sold, or taken with alcohol.  o Avoid use while working, driving, or operating heavy machinery.  o Side effects associated with the use of this particular medication reviewed. - Patient understands that this medication can cause CNS depression, increase his risk of falls, and even lead to overdose that may result in death, if used outside of the parameters that he and I discussed.  With all of this in mind, he knowingly accepts the risks and responsibilities associated with intended course of treatment, and elects to responsibly proceed as discussed.  Meds ordered this encounter  Medications  . methylPREDNISolone (MEDROL DOSEPAK) 4 MG TBPK tablet    Sig: Take by mouth daily - taper daily dose per package instructions.    Dispense:  21 tablet    Refill:  0  . traMADol (ULTRAM) 50 MG tablet    Sig: Take 1 tablet (50 mg total) by mouth every 8 (eight) hours as needed.    Dispense:  12 tablet    Refill:  0    Recommended Follow up Care:  Patient encouraged to follow up with the following provider within the specified time frame, or sooner as dictated by the severity of his symptoms. As always, he was instructed that for any urgent/emergent care needs, he should seek care either here or in the emergency department for more immediate evaluation.  Follow-up Information    Lada, Satira Anis, MD In 1 week.   Specialty: Family Medicine Why:  General reassessment of symptoms if not improving Contact information: [address] [place] [address] [phone]         NOTE: This note was prepared using Dragon dictation software along with smaller phrase technology. Despite my best ability to proofread, there is the potential that transcriptional errors may still occur from this process, and are completely unintentional.    Karen Kitchens, NP 03/02/20 0205

## 2020-05-02 ENCOUNTER — Ambulatory Visit: Payer: 59 | Attending: Internal Medicine

## 2020-05-02 DIAGNOSIS — Z23 Encounter for immunization: Secondary | ICD-10-CM

## 2020-05-02 NOTE — Progress Notes (Signed)
   Covid-19 Vaccination Clinic  Name:  Nathan [name]    MRN: [id number] DOB: 01-30-1976  05/02/2020  [name] was observed post Covid-19 immunization for 15 minutes without incident. He was provided with Vaccine Information Sheet and instruction to access the V-Safe system.   [name] was instructed to call 911 with any severe reactions post vaccine: Marland Kitchen Difficulty breathing  . Swelling of face and throat  . A fast heartbeat  . A bad rash all over body  . Dizziness and weakness   Immunizations Administered    Name Date Dose VIS Date Route   Pfizer COVID-19 Vaccine 05/02/2020 10:29 AM 0.3 mL 02/19/2019 Intramuscular   Manufacturer: [place]   Lot: [id number]   [place]: [id number]

## 2020-05-26 ENCOUNTER — Ambulatory Visit: Payer: 59 | Attending: Internal Medicine

## 2020-05-26 DIAGNOSIS — Z23 Encounter for immunization: Secondary | ICD-10-CM

## 2020-05-26 NOTE — Progress Notes (Signed)
   Covid-19 Vaccination Clinic  Name:  Nathan [name]    MRN: [id number] DOB: November 12, 1976  05/26/2020  Mr. Nathan [name] was observed post Covid-19 immunization for 15 minutes without incident. He was provided with Vaccine Information Sheet and instruction to access the V-Safe system.   Mr. Nathan [name] was instructed to call 911 with any severe reactions post vaccine: Marland Kitchen Difficulty breathing  . Swelling of face and throat  . A fast heartbeat  . A bad rash all over body  . Dizziness and weakness   Immunizations Administered    Name Date Dose VIS Date Route   Pfizer COVID-19 Vaccine 05/26/2020  3:59 PM 0.3 mL 02/19/2019 Intramuscular   Manufacturer: [place]   Lot: [id number]   [place]: [id number]

## 2020-07-21 ENCOUNTER — Encounter: Payer: 59 | Admitting: Internal Medicine

## 2020-07-31 NOTE — Progress Notes (Deleted)
Patient is a 44 year old male Last visit at [facility] was in December 2020 for hip pain. A prior follow-up visit before that was in January 2020. Last labs obtained were in January 2020 with those reviewed (with a repeat CBC obtained in February noting a white count that returned to normal) He follows up today.   Hyperlipidemia- Medication regimen-none Lab Results  Component Value Date   CHOL 199 01/11/2019   HDL 39 (L) 01/11/2019   LDLCALC 126 (H) 01/11/2019   TRIG 202 (H) 01/11/2019   CHOLHDL 5.1 (H) 01/11/2019   Denies chest pain, palpitations, shortness of breath, increased lower extremity swelling, increased headaches, vision changes, myalgias  Morbid obesity Wt Readings from Last 3 Encounters:  03/01/20 291 lb (132 kg)  12/26/19 295 lb 1.6 oz (133.9 kg)  01/11/19 290 lb 1.6 oz (131.6 kg)   Diet- Exercise-  OSA; has a CPAP machine but doesn't use it;   GERD Medication regimen-was started on omeprazole at 1 point in the past for this Denies problems swallowing, dark or black stools, nausea or vomiting, persistent abdominal pain.  Vitamin D deficiency-  Lab Results  Component Value Date   VD25OH 82 (L) 01/11/2019    Family history of colon cancer -mother died at age 52 from this Past colonoscopy-October 2018:  2 colonic polyps, 2 rectal polyps, and internal hemorrhoids noted.  One polyp was an adenoma.  Follow-up recommended in 3 years He also had an entirely normal EGD at that time.  Prostate cancer screening No family history Gets up   times at night to urinate, denies marked urgency, frequency, hesitancy, no hematuria Discussed the role of PSAs and prostate cancer screening in the future, can start at age 11 to include.  Tobacco-no current use, quit approximately 15 years ago

## 2020-08-04 ENCOUNTER — Encounter: Payer: 59 | Admitting: Internal Medicine

## 2020-08-18 ENCOUNTER — Other Ambulatory Visit: Payer: Self-pay

## 2020-08-18 DIAGNOSIS — K219 Gastro-esophageal reflux disease without esophagitis: Secondary | ICD-10-CM

## 2020-08-18 MED ORDER — OMEPRAZOLE 20 MG PO CPDR: DELAYED_RELEASE_CAPSULE | ORAL | 1 refills | Status: DC

## 2020-08-27 ENCOUNTER — Other Ambulatory Visit: Payer: Self-pay

## 2020-08-27 MED ORDER — PANTOPRAZOLE SODIUM 20 MG PO TBEC
20.0000 mg | DELAYED_RELEASE_TABLET | Freq: Every day | ORAL | 1 refills | Status: DC
Start: 2020-08-27 — End: 2020-10-21

## 2020-08-27 NOTE — Telephone Encounter (Signed)
Insurance wen backt to [place] and they will not cover his omeprazole medication they prefer pantoprazole.   Copied from [place] [phone]. Topic: General - Other >> Aug 26, 2020  1:57 PM [name], Nathan wrote: Reason for CRM: Pt stated his insurance has changed to [facility] and he needs to speak with his nurse at [facility] regarding Rx change. Pt requests call back. Cb# [phone]

## 2020-10-12 ENCOUNTER — Other Ambulatory Visit: Payer: Self-pay

## 2020-10-12 DIAGNOSIS — M7750 Other enthesopathy of unspecified foot: Secondary | ICD-10-CM

## 2020-10-12 MED ORDER — CELECOXIB 100 MG PO CAPS: 100.0000 mg | ORAL_CAPSULE | Freq: Two times a day (BID) | ORAL | 1 refills | Status: DC | PRN

## 2020-10-21 ENCOUNTER — Telehealth: Payer: Self-pay

## 2020-10-21 DIAGNOSIS — Z1211 Encounter for screening for malignant neoplasm of colon: Secondary | ICD-10-CM

## 2020-10-21 MED ORDER — PANTOPRAZOLE SODIUM 20 MG PO TBEC: 20.0000 mg | DELAYED_RELEASE_TABLET | Freq: Every day | ORAL | 1 refills | Status: DC

## 2020-10-21 NOTE — Telephone Encounter (Signed)
Pt never picked up this rx and he was calling today to follow up. [facility] states this Rx  pantoprazole (PROTONIX) 20 MG tablet  Requires a prior auth.  When she confirmed the fax number, they had been sending to Dr Hendrickson's old office. The pharmacist states she is going to manually send to prior auth request to your fax. Pt hoping to get rx asap. Thanks.  [id number] [address], [place] Phone:  [phone]  Fax:  [phone]

## 2020-10-21 NOTE — Addendum Note (Signed)
Addended by: Chilton Greathouse on: 10/21/2020 10:19 AM   Modules accepted: Orders

## 2020-10-21 NOTE — Telephone Encounter (Signed)
Copied from [place] [phone]. Topic: Referral - Request for Referral >> Oct 21, 2020 10:22 AM Scherrie Gerlach wrote: Pt needs referral for his colonoscopy that is overdue.  Pt has to get every 3 yrs.

## 2020-10-27 ENCOUNTER — Other Ambulatory Visit: Payer: Self-pay | Admitting: Internal Medicine

## 2020-10-27 NOTE — Progress Notes (Signed)
Name: Nathan [name]   MRN: [id number]    DOB: 1976/11/15   Date:10/28/2020       Progress Note  Subjective  Chief Complaint  Chief Complaint  Patient presents with  . Annual Exam    HPI  Patient is a 44 year old male Last visit at [facility] was with [name] 12/26/2019 Last annual exam was in 2018 Follows up today for annual exam, prompted by needing a referral for his follow-up colonoscopy  Has been followed previously for   GERD, On no meds to help presently Taking prilosec OTC  chronic foot pain Bone spur of the foot noted in history Taking celebrex presently to help  Morbid obesity Wt Readings from Last 3 Encounters:  10/28/20 291 lb 3.2 oz (132.1 kg)  03/01/20 291 lb (132 kg)  12/26/19 295 lb 1.6 oz (133.9 kg)   Weight stable in recent past, he struggles trying to lose weight, and has not been successful in the past couple years.  Obstructive sleep apnea on CPAP at times  Hyperlipidemia Lab Results  Component Value Date   CHOL 199 01/11/2019   HDL 39 (L) 01/11/2019   LDLCALC 126 (H) 01/11/2019   TRIG 202 (H) 01/11/2019   CHOLHDL 5.1 (H) 01/11/2019    FHx of colon cancer (mother had colon cancer, passed away at age 49, 7-8 months after diagnosis) He has colonoscopies recommended every 3 years Last colonoscopy in 2018 with 3 polyps noted, one adenoma noted  Vitamin D deficiency Last vitamin D Lab Results  Component Value Date   VD25OH 41 (L) 01/11/2019    USPSTF grade A and B recommendations:  Diet:   notes tries to watch and eat healthy,  Exercise: notes no regular exercise, active at work   Depression: phq 9 is negative Depression screen [facility] 2/9 10/28/2020 12/26/2019 01/11/2019 03/16/2018 09/05/2017  Decreased Interest 0 0 0 0 0  Down, Depressed, Hopeless 0 0 0 0 0  PHQ - 2 Score 0 0 0 0 0  Altered sleeping - 0 0 - -  Tired, decreased energy - 0 0 - -  Change in appetite - 0 0 - -  Feeling bad or failure about yourself  - 0 0 - -  Trouble  concentrating - 0 0 - -  Moving slowly or fidgety/restless - 0 0 - -  Suicidal thoughts - 0 0 - -  PHQ-9 Score - 0 0 - -  Difficult doing work/chores - Not difficult at all Not difficult at all - -    Hypertension:  BP Readings from Last 3 Encounters:  10/28/20 120/90  03/01/20 (!) 131/95  12/26/19 130/72    Obesity: Wt Readings from Last 3 Encounters:  10/28/20 291 lb 3.2 oz (132.1 kg)  03/01/20 291 lb (132 kg)  12/26/19 295 lb 1.6 oz (133.9 kg)   BMI Readings from Last 3 Encounters:  10/28/20 40.61 kg/m  03/01/20 41.16 kg/m  12/26/19 41.16 kg/m     Lipids:  Lab Results  Component Value Date   CHOL 199 01/11/2019   CHOL 195 03/16/2018   Lab Results  Component Value Date   HDL 39 (L) 01/11/2019   HDL 29 (L) 03/16/2018   Lab Results  Component Value Date   LDLCALC 126 (H) 01/11/2019   LDLCALC 122 (H) 03/16/2018   Lab Results  Component Value Date   TRIG 202 (H) 01/11/2019   TRIG 286 (H) 03/16/2018   Lab Results  Component Value Date   CHOLHDL 5.1 (H)  01/11/2019   CHOLHDL 6.7 (H) 03/16/2018   No results found for: LDLDIRECT  Glucose:  Glucose, Bld  Date Value Ref Range Status  01/11/2019 86 65 - 99 mg/dL Final    Comment:    .            Fasting reference interval .   03/16/2018 80 65 - 139 mg/dL Final    Comment:    .        Non-fasting reference interval .       [facility] Visit from 12/26/2019 in [facility]  AUDIT-C Score 0     Tobacco-former smoker, quit 2008 Alcohol use- denied use  Married STD testing and prevention (HIV/chl/gon/syphilis): declined  Hep C: Agrees to get and ordered  Skin cancer: No concerning skin lesions presently Colorectal cancer: + FH as above noted, colonoscopies every 3 years presently with history of polyps as noted above, due presently for colonoscopy  Prostate cancer: Denies family history Up rarely at night to urinate Denies hesitancy, urgency, frequency, hematuria Has not had  prior PSAs, and discussed the role of PSAs to help with screening in the future. Plan to check first PSA at age 39.  Lung cancer:  Low Dose CT Chest recommended if Age 31-80 years, 20 pack-year currently smoking OR have quit w/in 15years. Stop screening once a person has not smoked for 15 years or has a health problem that limits life.Patient does not qualify.   AAA: N/A   - The USPSTF recommends one-time screening with ultrasonography in men ages 14 to 64 years who have ever smoked ECG:   No concerning sx's with no chest pain, palpitations, shortness of breath, increased swelling  Advanced Care Planning: A voluntary discussion about advance care planning including the explanation and discussion of advance directives.  Discussed health care proxy and Living will, and the patient was able to identify a health care proxy as wife [name].  Patient does not have a living will at present time. If patient does have living will, I have requested they bring this to the clinic to be scanned in to their chart.  Patient Active Problem List   Diagnosis Date Noted  . Vitamin D deficiency 01/15/2019  . Bone spur of foot 03/16/2018  . Hemorrhoid 03/16/2018  . Family history of colon cancer   . Benign neoplasm of ascending colon   . Benign neoplasm of transverse colon   . Rectal polyp   . Heartburn   . GERD (gastroesophageal reflux disease) 09/05/2017  . Bilateral elbow joint pain 11/23/2016  . Obstructive sleep apnea on CPAP 11/12/2015  . Morbid obesity ([place]) 11/12/2015  . Syncope and collapse 09/24/2015  . Snoring 09/24/2015  . Abnormal EKG 09/24/2015    Past Surgical History:  Procedure Laterality Date  . COLONOSCOPY WITH PROPOFOL N/A 10/20/2017   Procedure: COLONOSCOPY WITH PROPOFOL;  Surgeon: Lucilla Lame, MD;  Location: [facility];  Service: Endoscopy;  Laterality: N/A;  . ESOPHAGOGASTRODUODENOSCOPY (EGD) WITH PROPOFOL N/A 10/20/2017   Procedure: ESOPHAGOGASTRODUODENOSCOPY (EGD)  WITH PROPOFOL;  Surgeon: Lucilla Lame, MD;  Location: [place];  Service: Endoscopy;  Laterality: N/A;  sleep apnea  . GALLBLADDER SURGERY      Family History  Problem Relation Age of Onset  . Cancer Mother   . Stroke Father   . Heart disease Father   . Diabetes Father     Social History   Socioeconomic History  . Marital status: Married    Spouse name:  Not on file  . Number of children: Not on file  . Years of education: Not on file  . Highest education level: Not on file  Occupational History  . Not on file  Tobacco Use  . Smoking status: Former Smoker    Packs/day: 1.00    Years: 16.00    Pack years: 16.00    Types: Cigarettes    Quit date: 2008    Years since quitting: 13.8  . Smokeless tobacco: Current User    Types: Snuff, Chew  Vaping Use  . Vaping Use: Never used  Substance and Sexual Activity  . Alcohol use: No  . Drug use: No  . Sexual activity: Not on file  Other Topics Concern  . Not on file  Social History Narrative  . Not on file   Social Determinants of Health   Financial Resource Strain: Low Risk   . Difficulty of Paying Living Expenses: Not hard at all  Food Insecurity: No Food Insecurity  . Worried About Charity fundraiser in the Last Year: Never true  . Ran Out of Food in the Last Year: Never true  Transportation Needs: No Transportation Needs  . Lack of Transportation (Medical): No  . Lack of Transportation (Non-Medical): No  Physical Activity: Sufficiently Active  . Days of Exercise per Week: 5 days  . Minutes of Exercise per Session: 80 min  Stress: Stress Concern Present  . Feeling of Stress : To some extent  Social Connections: Moderately Integrated  . Frequency of Communication with Friends and Family: More than three times a week  . Frequency of Social Gatherings with Friends and Family: More than three times a week  . Attends Religious Services: Never  . Active Member of Clubs or Organizations: Yes  . Attends English as a second language teacher Meetings: Never  . Marital Status: Married  Human resources officer Violence: Not At Risk  . Fear of Current or Ex-Partner: No  . Emotionally Abused: No  . Physically Abused: No  . Sexually Abused: No     Current Outpatient Medications:  .  celecoxib (CELEBREX) 100 MG capsule, Take 1 capsule (100 mg total) by mouth 2 (two) times daily as needed., Disp: 180 capsule, Rfl: 1 .  pantoprazole (PROTONIX) 20 MG tablet, Take 1 tablet (20 mg total) by mouth daily., Disp: 90 tablet, Rfl: 1  No Known Allergies   ROS: As noted above in HPI denies any recent unintentional weight loss,  No recent fevers or other Covid concerning sx's, had Covid vaccine No increase headaches, vision changes No CP, palpitations,  No increased SOB,  No persistent abdominal pains or change in bowel habits,  No rectal bleeding or dark/black stools,  No flank pains or dysuria,  No frequency or urgency No lower extremity swelling,  No increase joint aches or muscle aches, does often get some heel discomfort especially when up and down ladders all day at work No numbness, tingling or weakness in the extremities Denies other specific complaints on systems review except as noted in HPI    Objective  Vitals:   10/28/20 1437  BP: 120/90  Pulse: 96  Resp: 16  Temp: 98.4 F (36.9 C)  TempSrc: Oral  SpO2: 98%  Weight: 291 lb 3.2 oz (132.1 kg)  Height: 5\' 11"  (1.803 m)   Recheck blood pressure 118/86 on the left with an adult cuff by myself  Body mass index is 40.61 kg/m.  NAD, pleasant, masked HEENT - sclera anicteric, PERRL, EOMI, conj -  non-inj'ed, TM's and canals clear, pharynx clear Neck - supple, no adenopathy, no TM, carotids 2+ and = without bruits bilat Car - RRR without m/g/r Pulm- CTA without wheeze or rales Abd - soft, NT, obese, ND, BS+, no obvious HSM, no masses Back - no CVA tenderness,  Skin- no new lesions of concern on exposed areas, denied otherwise, positive tattoo on left  upper extremity Ext - no LE edema,  GU/Rectal: not done, denies any concerning symptoms, prostate exam deferred (without concerning sx's and after discussion on current recommendations for prostate CA screening including PSA tests) Neuro - affect was not flat, appropriate with conversation  Grossly non-focal with good strength on testing, sensation intact to LT in distal extremities, DTR's 1+ and = patella, Romberg neg, no pronator drift, good balance on one foot, good finger to nose, good tandem walk   No results found for this or any previous visit (from the past 2160 hour(s)).   PHQ2/9: Depression screen [facility] 2/9 10/28/2020 12/26/2019 01/11/2019 03/16/2018 09/05/2017  Decreased Interest 0 0 0 0 0  Down, Depressed, Hopeless 0 0 0 0 0  PHQ - 2 Score 0 0 0 0 0  Altered sleeping - 0 0 - -  Tired, decreased energy - 0 0 - -  Change in appetite - 0 0 - -  Feeling bad or failure about yourself  - 0 0 - -  Trouble concentrating - 0 0 - -  Moving slowly or fidgety/restless - 0 0 - -  Suicidal thoughts - 0 0 - -  PHQ-9 Score - 0 0 - -  Difficult doing work/chores - Not difficult at all Not difficult at all - -     Fall Risk: Fall Risk  10/28/2020 12/26/2019 01/11/2019 03/16/2018 09/05/2017  Falls in the past year? 0 0 0 No No  Number falls in past yr: 0 0 1 - -  Injury with Fall? 0 0 0 - -  Follow up - Falls evaluation completed - - -     Functional Status Survey: Is the patient deaf or have difficulty hearing?: No Does the patient have difficulty seeing, even when wearing glasses/contacts?: No Does the patient have difficulty concentrating, remembering, or making decisions?: No Does the patient have difficulty walking or climbing stairs?: No Does the patient have difficulty dressing or bathing?: No Does the patient have difficulty doing errands alone such as visiting a doctor's office or shopping?: No   Assessment & Plan  General Health/Health Maintenance:   -Prostate cancer  screening and PSA options (with potential risks and benefits of testing vs not testing) were discussed along with recent recs/guidelines.  First check iron when turns age 64  -USPSTF grade A and B recommendations reviewed with patient; age-appropriate recommendations, preventive care, screening tests, etc discussed and encouraged; healthy living encouraged; see AVS for any further patient education given to patient  -Discussed importance of regular physical activity weekly,   -Discussed importance of eating healthy diet:   -Reviewed Health Maintenance:   1. Annual physical exam  2. Family history of colon cancer 3. Colon cancer screening/Colon polyps, one adenomatous Colonoscopy ordered, as he is due for a repeat.  4. Gastroesophageal reflux disease without esophagitis Presently using over-the-counter Prilosec to help. Reflux precautions also recommended to help If symptoms more frequent or more problematic will follow up  5. Obstructive sleep apnea on CPAP Uses only very intermittently, and recommended more frequent use   6. Morbid obesity ([place]) Discussed the importance of healthy weight  maintenance and the benefits of trying to lose some weight over time.  Dietary modifications combined with increasing physical activity levels important to help with healthy weight maintenance. He asked if there was something he could take to help with weight loss, and I did note that there are medications that can be helpful over time, but the first step is to check some lab test presently, and then if interested to schedule follow-up to further discuss. The risk/benefits of these medicines need to be reviewed before starting any.  7. Vitamin D deficiency Previous vitamin D level low. We will recheck vitamin D   8. Mixed hyperlipidemia We will recheck lipid panel Briefly discussed statins today and may consider pending his repeat lipid panel. He was nonfasting today, and will return to have the  labs obtained in the morning in the near future when fasting.  9. Bone spur of foot Uses Celebrex to help manage presently   10. Leukocytosis, unspecified type -noted on last lab check a year ago We will recheck CBC  11. Adenomatous polyp of colon, unspecified part of colon Repeat colonoscopy plan - Ambulatory referral to Gastroenterology  12. Need for immunization against influenza  - Flu Vaccine QUAD 36+ mos IM  13. Encounter for hepatitis C screening test for low risk patient Agreed to have the hepatitis C antibody checked - Hepatitis C antibody  14. Elevated blood pressure without the diagnosis of hypertension  Discussed his borderline initial blood pressure reading, and it did come down some on my check. He has not been on medications in the past for high blood pressure. Discussed neck steps, and he notes his daughter is a CNA and can check his blood pressure at home when asked about getting some outside readings. Noted if the top number is 140 or above, or if the bottom number is 90 or above with any regularity on checks at home, he needs to follow-up again. Await lab test, and will be in touch after with results. Tentatively, schedule a follow-up in a year time, although may need to be sooner pending lab results or blood pressure checks at home as noted above.  Shiela Mayer, MD

## 2020-10-28 ENCOUNTER — Encounter: Payer: Self-pay | Admitting: Internal Medicine

## 2020-10-28 ENCOUNTER — Ambulatory Visit (INDEPENDENT_AMBULATORY_CARE_PROVIDER_SITE_OTHER): Payer: BC Managed Care – PPO | Admitting: Internal Medicine

## 2020-10-28 ENCOUNTER — Other Ambulatory Visit: Payer: Self-pay

## 2020-10-28 VITALS — BP 120/90 | HR 96 | Temp 98.4°F | Resp 16 | Ht 71.0 in | Wt 291.2 lb

## 2020-10-28 DIAGNOSIS — Z0000 Encounter for general adult medical examination without abnormal findings: Secondary | ICD-10-CM | POA: Diagnosis not present

## 2020-10-28 DIAGNOSIS — K219 Gastro-esophageal reflux disease without esophagitis: Secondary | ICD-10-CM

## 2020-10-28 DIAGNOSIS — Z1211 Encounter for screening for malignant neoplasm of colon: Secondary | ICD-10-CM | POA: Diagnosis not present

## 2020-10-28 DIAGNOSIS — Z23 Encounter for immunization: Secondary | ICD-10-CM

## 2020-10-28 DIAGNOSIS — D72829 Elevated white blood cell count, unspecified: Secondary | ICD-10-CM

## 2020-10-28 DIAGNOSIS — Z800 Family history of malignant neoplasm of digestive organs: Secondary | ICD-10-CM | POA: Diagnosis not present

## 2020-10-28 DIAGNOSIS — E559 Vitamin D deficiency, unspecified: Secondary | ICD-10-CM

## 2020-10-28 DIAGNOSIS — G4733 Obstructive sleep apnea (adult) (pediatric): Secondary | ICD-10-CM

## 2020-10-28 DIAGNOSIS — D126 Benign neoplasm of colon, unspecified: Secondary | ICD-10-CM

## 2020-10-28 DIAGNOSIS — Z1159 Encounter for screening for other viral diseases: Secondary | ICD-10-CM

## 2020-10-28 DIAGNOSIS — E782 Mixed hyperlipidemia: Secondary | ICD-10-CM

## 2020-10-28 DIAGNOSIS — R030 Elevated blood-pressure reading, without diagnosis of hypertension: Secondary | ICD-10-CM

## 2020-10-28 DIAGNOSIS — Z9989 Dependence on other enabling machines and devices: Secondary | ICD-10-CM

## 2020-10-28 DIAGNOSIS — M7750 Other enthesopathy of unspecified foot: Secondary | ICD-10-CM

## 2020-11-05 ENCOUNTER — Other Ambulatory Visit: Payer: Self-pay

## 2020-11-05 ENCOUNTER — Telehealth (INDEPENDENT_AMBULATORY_CARE_PROVIDER_SITE_OTHER): Payer: Self-pay | Admitting: Gastroenterology

## 2020-11-05 DIAGNOSIS — Z1211 Encounter for screening for malignant neoplasm of colon: Secondary | ICD-10-CM

## 2020-11-05 DIAGNOSIS — Z800 Family history of malignant neoplasm of digestive organs: Secondary | ICD-10-CM

## 2020-11-05 DIAGNOSIS — Z86010 Personal history of colonic polyps: Secondary | ICD-10-CM

## 2020-11-05 MED ORDER — PEG 3350-KCL-NA BICARB-NACL 420 G PO SOLR: 4000.0000 mL | Freq: Once | ORAL | 0 refills | Status: AC

## 2020-11-05 NOTE — Progress Notes (Signed)
Gastroenterology Pre-Procedure Review  Request Date: Monday 11/16/20 Requesting Physician: Dr. Allen Norris  PATIENT REVIEW QUESTIONS: The patient responded to the following health history questions as indicated:    1. Are you having any GI issues? no 2. Do you have a personal history of Polyps? yes (10/20/17 Colon polyps noted on colonoscopy performed by Dr. Allen Norris) 3. Do you have a family history of Colon Cancer or Polyps? Yes parent 4. Diabetes Mellitus? no 5. Joint replacements in the past 12 months?no 6. Major health problems in the past 3 months?no 7. Any artificial heart valves, MVP, or defibrillator?no    MEDICATIONS & ALLERGIES:    Patient reports the following regarding taking any anticoagulation/antiplatelet therapy:   Plavix, Coumadin, Eliquis, Xarelto, Lovenox, Pradaxa, Brilinta, or Effient? no Aspirin? no  Patient confirms/reports the following medications:  Current Outpatient Medications  Medication Sig Dispense Refill  . celecoxib (CELEBREX) 100 MG capsule Take 1 capsule (100 mg total) by mouth 2 (two) times daily as needed. 180 capsule 1  . pantoprazole (PROTONIX) 20 MG tablet Take 1 tablet (20 mg total) by mouth daily. 90 tablet 1   No current facility-administered medications for this visit.    Patient confirms/reports the following allergies:  No Known Allergies  No orders of the defined types were placed in this encounter.   AUTHORIZATION INFORMATION Primary Insurance: 1D#: Group #:  Secondary Insurance: 1D#: Group #:  SCHEDULE INFORMATION: Date: 11/16/20 Time: Location:[facility]

## 2020-11-10 ENCOUNTER — Other Ambulatory Visit: Payer: Self-pay

## 2020-11-10 ENCOUNTER — Encounter: Payer: Self-pay | Admitting: Gastroenterology

## 2020-11-12 ENCOUNTER — Other Ambulatory Visit
Admission: RE | Admit: 2020-11-12 | Discharge: 2020-11-12 | Disposition: A | Discharge status: Home / Self Care | Payer: BC Managed Care – PPO | Source: Ambulatory Visit | Attending: Gastroenterology | Admitting: Gastroenterology

## 2020-11-12 ENCOUNTER — Other Ambulatory Visit: Payer: Self-pay

## 2020-11-12 DIAGNOSIS — Z01812 Encounter for preprocedural laboratory examination: Secondary | ICD-10-CM | POA: Insufficient documentation

## 2020-11-12 DIAGNOSIS — Z20822 Contact with and (suspected) exposure to covid-19: Secondary | ICD-10-CM | POA: Diagnosis not present

## 2020-11-12 DIAGNOSIS — K219 Gastro-esophageal reflux disease without esophagitis: Secondary | ICD-10-CM | POA: Diagnosis not present

## 2020-11-12 DIAGNOSIS — E559 Vitamin D deficiency, unspecified: Secondary | ICD-10-CM | POA: Diagnosis not present

## 2020-11-12 DIAGNOSIS — Z1159 Encounter for screening for other viral diseases: Secondary | ICD-10-CM | POA: Diagnosis not present

## 2020-11-12 DIAGNOSIS — D72829 Elevated white blood cell count, unspecified: Secondary | ICD-10-CM | POA: Diagnosis not present

## 2020-11-12 DIAGNOSIS — E782 Mixed hyperlipidemia: Secondary | ICD-10-CM | POA: Diagnosis not present

## 2020-11-13 LAB — COMPLETE METABOLIC PANEL WITH GFR
AG Ratio: 1.8 (calc) (ref 1.0–2.5)
ALT: 28 U/L (ref 9–46)
AST: 27 U/L (ref 10–40)
Albumin: 4.1 g/dL (ref 3.6–5.1)
Alkaline phosphatase (APISO): 63 U/L (ref 36–130)
BUN: 11 mg/dL (ref 7–25)
CO2: 27 mmol/L (ref 20–32)
Calcium: 9.1 mg/dL (ref 8.6–10.3)
Chloride: 105 mmol/L (ref 98–110)
Creat: 0.92 mg/dL (ref 0.60–1.35)
GFR, Est African American: 115 mL/min/{1.73_m2} (ref >=60)
GFR, Est Non African American: 99 mL/min/{1.73_m2} (ref >=60)
Globulin: 2.3 g/dL (calc) (ref 1.9–3.7)
Glucose, Bld: 101 mg/dL — ABNORMAL HIGH (ref 65–99)
Potassium: 4.3 mmol/L (ref 3.5–5.3)
Sodium: 140 mmol/L (ref 135–146)
Total Bilirubin: 0.5 mg/dL (ref 0.2–1.2)
Total Protein: 6.4 g/dL (ref 6.1–8.1)

## 2020-11-13 LAB — CBC WITH DIFFERENTIAL/PLATELET
Absolute Monocytes: 435 cells/uL (ref 200–950)
Basophils Absolute: 58 cells/uL (ref 0–200)
Basophils Relative: 0.9 %
Eosinophils Absolute: 262 cells/uL (ref 15–500)
Eosinophils Relative: 4.1 %
HCT: 48.6 % (ref 38.5–50.0)
Hemoglobin: 16.6 g/dL (ref 13.2–17.1)
Lymphs Abs: 1299 cells/uL (ref 850–3900)
MCH: 28.8 pg (ref 27.0–33.0)
MCHC: 34.2 g/dL (ref 32.0–36.0)
MCV: 84.4 fL (ref 80.0–100.0)
MPV: 10.1 fL (ref 7.5–12.5)
Monocytes Relative: 6.8 %
Neutro Abs: 4346 cells/uL (ref 1500–7800)
Neutrophils Relative %: 67.9 %
Platelets: 260 10*3/uL (ref 140–400)
RBC: 5.76 10*6/uL (ref 4.20–5.80)
RDW: 12.9 % (ref 11.0–15.0)
Total Lymphocyte: 20.3 %
WBC: 6.4 10*3/uL (ref 3.8–10.8)

## 2020-11-13 LAB — LIPID PANEL
Cholesterol: 177 mg/dL (ref <200)
HDL: 29 mg/dL — ABNORMAL LOW (ref >=40)
LDL Cholesterol (Calc): 115 mg/dL (calc) — ABNORMAL HIGH
Non-HDL Cholesterol (Calc): 148 mg/dL (calc) — ABNORMAL HIGH (ref <130)
Total CHOL/HDL Ratio: 6.1 (calc) — ABNORMAL HIGH (ref <5.0)
Triglycerides: 210 mg/dL — ABNORMAL HIGH (ref <150)

## 2020-11-13 LAB — VITAMIN D 25 HYDROXY (VIT D DEFICIENCY, FRACTURES): Vit D, 25-Hydroxy: 24 ng/mL — ABNORMAL LOW (ref 30–100)

## 2020-11-13 LAB — HEPATITIS C ANTIBODY
Hepatitis C Ab: NONREACTIVE
SIGNAL TO CUT-OFF: 0.06 (ref <1.00)

## 2020-11-13 LAB — SARS CORONAVIRUS 2 (TAT 6-24 HRS): SARS Coronavirus 2: NEGATIVE

## 2020-11-13 NOTE — Discharge Instructions (Signed)
General Anesthesia, Adult, Care After This sheet gives you information about how to care for yourself after your procedure. Your health care provider may also give you more specific instructions. If you have problems or questions, contact your health care provider. What can I expect after the procedure? After the procedure, the following side effects are common:  Pain or discomfort at the IV site.  Nausea.  Vomiting.  Sore throat.  Trouble concentrating.  Feeling cold or chills.  Weak or tired.  Sleepiness and fatigue.  Soreness and body aches. These side effects can affect parts of the body that were not involved in surgery. Follow these instructions at home:  For at least 24 hours after the procedure:  Have a responsible adult stay with you. It is important to have someone help care for you until you are awake and alert.  Rest as needed.  Do not: ? Participate in activities in which you could fall or become injured. ? Drive. ? Use heavy machinery. ? Drink alcohol. ? Take sleeping pills or medicines that cause drowsiness. ? Make important decisions or sign legal documents. ? Take care of children on your own. Eating and drinking  Follow any instructions from your health care provider about eating or drinking restrictions.  When you feel hungry, start by eating small amounts of foods that are soft and easy to digest (bland), such as toast. Gradually return to your regular diet.  Drink enough fluid to keep your urine pale yellow.  If you vomit, rehydrate by drinking water, juice, or clear broth. General instructions  If you have sleep apnea, surgery and certain medicines can increase your risk for breathing problems. Follow instructions from your health care provider about wearing your sleep device: ? Anytime you are sleeping, including during daytime naps. ? While taking prescription pain medicines, sleeping medicines, or medicines that make you drowsy.  Return to  your normal activities as told by your health care provider. Ask your health care provider what activities are safe for you.  Take over-the-counter and prescription medicines only as told by your health care provider.  If you smoke, do not smoke without supervision.  Keep all follow-up visits as told by your health care provider. This is important. Contact a health care provider if:  You have nausea or vomiting that does not get better with medicine.  You cannot eat or drink without vomiting.  You have pain that does not get better with medicine.  You are unable to pass urine.  You develop a skin rash.  You have a fever.  You have redness around your IV site that gets worse. Get help right away if:  You have difficulty breathing.  You have chest pain.  You have blood in your urine or stool, or you vomit blood. Summary  After the procedure, it is common to have a sore throat or nausea. It is also common to feel tired.  Have a responsible adult stay with you for the first 24 hours after general anesthesia. It is important to have someone help care for you until you are awake and alert.  When you feel hungry, start by eating small amounts of foods that are soft and easy to digest (bland), such as toast. Gradually return to your regular diet.  Drink enough fluid to keep your urine pale yellow.  Return to your normal activities as told by your health care provider. Ask your health care provider what activities are safe for you. This information is not   intended to replace advice given to you by your health care provider. Make sure you discuss any questions you have with your health care provider. Document Revised: 12/15/2017 Document Reviewed: 07/28/2017 Elsevier Patient Education  2020 [employer]  

## 2020-11-16 ENCOUNTER — Encounter
Admission: RE | Disposition: A | Discharge status: Home / Self Care | Payer: Self-pay | Source: Home / Self Care | Attending: Gastroenterology

## 2020-11-16 ENCOUNTER — Ambulatory Visit
Admission: RE | Admit: 2020-11-16 | Discharge: 2020-11-16 | Disposition: A | Discharge status: Home / Self Care | Payer: BC Managed Care – PPO | Attending: Gastroenterology | Admitting: Gastroenterology

## 2020-11-16 ENCOUNTER — Ambulatory Visit: Payer: BC Managed Care – PPO | Admitting: Anesthesiology

## 2020-11-16 ENCOUNTER — Encounter: Payer: Self-pay | Admitting: Gastroenterology

## 2020-11-16 DIAGNOSIS — Z87891 Personal history of nicotine dependence: Secondary | ICD-10-CM | POA: Diagnosis not present

## 2020-11-16 DIAGNOSIS — Z860100 Personal history of colon polyps, unspecified: Secondary | ICD-10-CM

## 2020-11-16 DIAGNOSIS — K648 Other hemorrhoids: Secondary | ICD-10-CM | POA: Diagnosis not present

## 2020-11-16 DIAGNOSIS — D126 Benign neoplasm of colon, unspecified: Secondary | ICD-10-CM | POA: Diagnosis not present

## 2020-11-16 DIAGNOSIS — K5730 Diverticulosis of large intestine without perforation or abscess without bleeding: Secondary | ICD-10-CM | POA: Insufficient documentation

## 2020-11-16 DIAGNOSIS — Z1211 Encounter for screening for malignant neoplasm of colon: Secondary | ICD-10-CM | POA: Diagnosis not present

## 2020-11-16 DIAGNOSIS — K635 Polyp of colon: Secondary | ICD-10-CM | POA: Insufficient documentation

## 2020-11-16 DIAGNOSIS — K219 Gastro-esophageal reflux disease without esophagitis: Secondary | ICD-10-CM | POA: Diagnosis not present

## 2020-11-16 DIAGNOSIS — Z809 Family history of malignant neoplasm, unspecified: Secondary | ICD-10-CM | POA: Diagnosis not present

## 2020-11-16 DIAGNOSIS — D120 Benign neoplasm of cecum: Secondary | ICD-10-CM | POA: Insufficient documentation

## 2020-11-16 DIAGNOSIS — Z79899 Other long term (current) drug therapy: Secondary | ICD-10-CM | POA: Diagnosis not present

## 2020-11-16 DIAGNOSIS — Z800 Family history of malignant neoplasm of digestive organs: Secondary | ICD-10-CM

## 2020-11-16 HISTORY — PX: COLONOSCOPY WITH PROPOFOL: SHX5780

## 2020-11-16 HISTORY — PX: POLYPECTOMY: SHX5525

## 2020-11-16 SURGERY — COLONOSCOPY WITH PROPOFOL
Anesthesia: General | Site: Rectum

## 2020-11-16 MED ORDER — SODIUM CHLORIDE 0.9 % IV SOLN: INTRAVENOUS | Status: DC

## 2020-11-16 MED ORDER — STERILE WATER FOR IRRIGATION IR SOLN: Status: DC | PRN

## 2020-11-16 MED ORDER — LACTATED RINGERS IV SOLN: INTRAVENOUS | Status: DC

## 2020-11-16 MED ORDER — LIDOCAINE HCL (CARDIAC) PF 100 MG/5ML IV SOSY
PREFILLED_SYRINGE | INTRAVENOUS | Status: DC | PRN
  Administered 2020-11-16: 30 mg via INTRAVENOUS

## 2020-11-16 MED ORDER — PROPOFOL 10 MG/ML IV BOLUS
INTRAVENOUS | Status: DC | PRN
  Administered 2020-11-16: 40 mg via INTRAVENOUS
  Administered 2020-11-16: 20 mg via INTRAVENOUS
  Administered 2020-11-16: 30 mg via INTRAVENOUS
  Administered 2020-11-16: 40 mg via INTRAVENOUS
  Administered 2020-11-16: 100 mg via INTRAVENOUS
  Administered 2020-11-16 (×2): 40 mg via INTRAVENOUS
  Administered 2020-11-16 (×2): 30 mg via INTRAVENOUS

## 2020-11-16 SURGICAL SUPPLY — 9 items
FORCEPS BIOP RAD 4 LRG CAP 4 (CUTTING FORCEPS) ×1 IMPLANT
GOWN CVR UNV OPN BCK APRN NK (MISCELLANEOUS) ×2 IMPLANT
GOWN ISOL THUMB LOOP REG UNIV (MISCELLANEOUS) ×4
KIT PRC NS LF DISP ENDO (KITS) ×1 IMPLANT
KIT PROCEDURE OLYMPUS (KITS) ×2
MANIFOLD NEPTUNE II (INSTRUMENTS) ×2 IMPLANT
SNARE SHORT THROW 13M SML OVAL (MISCELLANEOUS) ×1 IMPLANT
TRAP ETRAP POLY (MISCELLANEOUS) ×1 IMPLANT
WATER STERILE IRR 250ML POUR (IV SOLUTION) ×2 IMPLANT

## 2020-11-16 NOTE — Op Note (Signed)
[facility] Gastroenterology Patient Name: Nathan [name] Procedure Date: 11/16/2020 8:40 AM MRN: [id number] Account #: [id number] Date of Birth: 23-Feb-1976 Admit Type: Outpatient Age: 44 Room: [facility] OR ROOM 01 Gender: Male Note Status: Finalized Procedure:             Colonoscopy Indications:           High risk colon cancer surveillance: Personal history                         of colonic polyps Providers:             Lucilla Lame MD, MD Referring MD:          Carola Frost. Roxan Hockey (Referring MD) Medicines:             Propofol per Anesthesia Complications:         No immediate complications. Procedure:             Pre-Anesthesia Assessment:                        - Prior to the procedure, a History and Physical was                         performed, and patient medications and allergies were                         reviewed. The patient's tolerance of previous                         anesthesia was also reviewed. The risks and benefits                         of the procedure and the sedation options and risks                         were discussed with the patient. All questions were                         answered, and informed consent was obtained. Prior                         Anticoagulants: The patient has taken no previous                         anticoagulant or antiplatelet agents. ASA Grade                         Assessment: II - A patient with mild systemic disease.                         After reviewing the risks and benefits, the patient                         was deemed in satisfactory condition to undergo the                         procedure.  After obtaining informed consent, the colonoscope was                         passed under direct vision. Throughout the procedure,                         the patient's blood pressure, pulse, and oxygen                         saturations were monitored continuously. The was                          introduced through the anus and advanced to the the                         cecum, identified by appendiceal orifice and ileocecal                         valve. The colonoscopy was performed without                         difficulty. The patient tolerated the procedure well.                         The quality of the bowel preparation was good. Findings:      The perianal and digital rectal examinations were normal.      A 3 mm polyp was found in the cecum. The polyp was sessile. The polyp       was removed with a cold snare. Resection and retrieval were complete.      Two sessile polyps were found in the sigmoid colon. The polyps were 2 to       3 mm in size. These polyps were removed with a cold biopsy forceps.       Resection and retrieval were complete.      Two sessile polyps were found in the sigmoid colon. The polyps were 4 to       5 mm in size. These polyps were removed with a cold snare. Resection and       retrieval were complete.      Non-bleeding internal hemorrhoids were found during retroflexion. The       hemorrhoids were Grade I (internal hemorrhoids that do not prolapse).      A few small-mouthed diverticula were found in the sigmoid colon. Impression:            - One 3 mm polyp in the cecum, removed with a cold                         snare. Resected and retrieved.                        - Two 2 to 3 mm polyps in the sigmoid colon, removed                         with a cold biopsy forceps. Resected and retrieved.                        - Two 4 to 5 mm polyps in the  sigmoid colon, removed                         with a cold snare. Resected and retrieved.                        - Non-bleeding internal hemorrhoids.                        - Diverticulosis in the sigmoid colon. Recommendation:        - Discharge patient to home.                        - Resume previous diet.                        - Continue present medications.                        -  Await pathology results.                        - Repeat colonoscopy in 5 years for surveillance. Procedure Code(s):     --- Professional ---                        [phone], Colonoscopy, flexible; with removal of                         tumor(s), polyp(s), or other lesion(s) by snare                         technique                        45380, 40, Colonoscopy, flexible; with biopsy, single                         or multiple Diagnosis Code(s):     --- Professional ---                        Z86.010, Personal history of colonic polyps                        K63.5, Polyp of colon CPT copyright 2019 American Medical Association. All rights reserved. The codes documented in this report are preliminary and upon coder review may  be revised to meet current compliance requirements. Lucilla Lame MD, MD 11/16/2020 9:08:00 AM This report has been signed electronically. Number of Addenda: 0 Note Initiated On: 11/16/2020 8:40 AM Scope Withdrawal Time: 0 hours 12 minutes 31 seconds  Total Procedure Duration: 0 hours 13 minutes 58 seconds  Estimated Blood Loss:  Estimated blood loss: none.      [facility]

## 2020-11-16 NOTE — Transfer of Care (Signed)
Immediate Anesthesia Transfer of Care Note  Patient: Nathan [name]  Procedure(s) Performed: COLONOSCOPY WITH BIOPSY (N/A Rectum) POLYPECTOMY (N/A Rectum)  Patient Location: PACU  Anesthesia Type: General  Level of Consciousness: awake, alert  and patient cooperative  Airway and Oxygen Therapy: Patient Spontanous Breathing and Patient connected to supplemental oxygen  Post-op Assessment: Post-op Vital signs reviewed, Patient's Cardiovascular Status Stable, Respiratory Function Stable, Patent Airway and No signs of Nausea or vomiting  Post-op Vital Signs: Reviewed and stable  Complications: No complications documented.

## 2020-11-16 NOTE — H&P (Signed)
Lucilla Lame, MD [address] [place], [place] [address] Phone:[phone] Fax : [phone]  Primary Care Physician:  Towanda Malkin, MD Primary Gastroenterologist:  Dr. Allen Norris  Pre-Procedure History & Physical: HPI:  Nathan [name] is a 44 y.o. male is here for an colonoscopy.   Past Medical History:  Diagnosis Date  . GERD (gastroesophageal reflux disease)   . OSA (obstructive sleep apnea)     Past Surgical History:  Procedure Laterality Date  . COLONOSCOPY WITH PROPOFOL N/A 10/20/2017   Procedure: COLONOSCOPY WITH PROPOFOL;  Surgeon: Lucilla Lame, MD;  Location: [place];  Service: Endoscopy;  Laterality: N/A;  . ESOPHAGOGASTRODUODENOSCOPY (EGD) WITH PROPOFOL N/A 10/20/2017   Procedure: ESOPHAGOGASTRODUODENOSCOPY (EGD) WITH PROPOFOL;  Surgeon: Lucilla Lame, MD;  Location: [place];  Service: Endoscopy;  Laterality: N/A;  sleep apnea  . GALLBLADDER SURGERY      Prior to Admission medications   Medication Sig Start Date End Date Taking? Authorizing Provider  celecoxib (CELEBREX) 100 MG capsule Take 1 capsule (100 mg total) by mouth 2 (two) times daily as needed. 10/12/20  Yes Towanda Malkin, MD  pantoprazole (PROTONIX) 20 MG tablet Take 1 tablet (20 mg total) by mouth daily. 10/21/20  Yes Towanda Malkin, MD  gabapentin (NEURONTIN) 300 MG capsule Take 1 capsule (300 mg total) by mouth at bedtime. 12/26/19 03/01/20  Hubbard Hartshorn, FNP    Allergies as of 11/05/2020  . (No Known Allergies)    Family History  Problem Relation Age of Onset  . Cancer Mother   . Stroke Father   . Heart disease Father   . Diabetes Father     Social History   Socioeconomic History  . Marital status: Married    Spouse name: Not on file  . Number of children: Not on file  . Years of education: Not on file  . Highest education level: Not on file  Occupational History  . Not on file  Tobacco Use  . Smoking status: Former  Smoker    Packs/day: 1.00    Years: 16.00    Pack years: 16.00    Types: Cigarettes    Quit date: 2008    Years since quitting: 13.9  . Smokeless tobacco: Current User    Types: Snuff, Chew  Vaping Use  . Vaping Use: Never used  Substance and Sexual Activity  . Alcohol use: No  . Drug use: No  . Sexual activity: Not on file  Other Topics Concern  . Not on file  Social History Narrative  . Not on file   Social Determinants of Health   Financial Resource Strain: Low Risk   . Difficulty of Paying Living Expenses: Not hard at all  Food Insecurity: No Food Insecurity  . Worried About Charity fundraiser in the Last Year: Never true  . Ran Out of Food in the Last Year: Never true  Transportation Needs: No Transportation Needs  . Lack of Transportation (Medical): No  . Lack of Transportation (Non-Medical): No  Physical Activity: Sufficiently Active  . Days of Exercise per Week: 5 days  . Minutes of Exercise per Session: 80 min  Stress: Stress Concern Present  . Feeling of Stress : To some extent  Social Connections: Moderately Integrated  . Frequency of Communication with Friends and Family: More than three times a week  . Frequency of Social Gatherings with Friends and Family: More than three times a week  . Attends Religious Services:  Never  . Active Member of Clubs or Organizations: Yes  . Attends Archivist Meetings: Never  . Marital Status: Married  Human resources officer Violence: Not At Risk  . Fear of Current or Ex-Partner: No  . Emotionally Abused: No  . Physically Abused: No  . Sexually Abused: No    Review of Systems: See HPI, otherwise negative ROS  Physical Exam: BP 123/73   Pulse 73   Temp (!) 97 F (36.1 C) (Temporal)   Resp 16   Ht 5\' 11"  (1.803 m)   Wt 127.9 kg   SpO2 96%   BMI 39.33 kg/m  General:   Alert,  pleasant and cooperative in NAD Head:  Normocephalic and atraumatic. Neck:  Supple; no masses or thyromegaly. Lungs:  Clear  throughout to auscultation.    Heart:  Regular rate and rhythm. Abdomen:  Soft, nontender and nondistended. Normal bowel sounds, without guarding, and without rebound.   Neurologic:  Alert and  oriented x4;  grossly normal neurologically.  Impression/Plan: Nathan [name] is here for an colonoscopy to be performed for a history of adenomatous polyps on 09/2017   Risks, benefits, limitations, and alternatives regarding  colonoscopy have been reviewed with the patient.  Questions have been answered.  All parties agreeable.   Lucilla Lame, MD  11/16/2020, 8:37 AM

## 2020-11-16 NOTE — Anesthesia Procedure Notes (Signed)
Date/Time: 11/16/2020 8:46 AM Performed by: Cameron Ali, CRNA Pre-anesthesia Checklist: Patient identified, Emergency Drugs available, Suction available, Timeout performed and Patient being monitored Patient Re-evaluated:Patient Re-evaluated prior to induction Oxygen Delivery Method: Nasal cannula Placement Confirmation: positive ETCO2

## 2020-11-16 NOTE — Anesthesia Postprocedure Evaluation (Signed)
Anesthesia Post Note  Patient: Nathan [name]  Procedure(s) Performed: COLONOSCOPY WITH BIOPSY (N/A Rectum) POLYPECTOMY (N/A Rectum)     Patient location during evaluation: PACU Anesthesia Type: General Level of consciousness: awake and alert Pain management: pain level controlled Vital Signs Assessment: post-procedure vital signs reviewed and stable Respiratory status: spontaneous breathing Cardiovascular status: stable Anesthetic complications: no   No complications documented.  Gillian Scarce

## 2020-11-16 NOTE — Anesthesia Preprocedure Evaluation (Signed)
Anesthesia Evaluation  Patient identified by MRN, date of birth, ID band Patient awake    Reviewed: Allergy & Precautions, H&P , NPO status , Patient's Chart, lab work & pertinent test results  Airway Mallampati: II  TM Distance: >3 FB Neck ROM: full    Dental no notable dental hx.    Pulmonary sleep apnea , former smoker,    Pulmonary exam normal        Cardiovascular negative cardio ROS Normal cardiovascular exam Rhythm:regular Rate:Normal     Neuro/Psych negative neurological ROS     GI/Hepatic Neg liver ROS, Medicated,  Endo/Other  negative endocrine ROS  Renal/GU negative Renal ROS  negative genitourinary   Musculoskeletal   Abdominal   Peds  Hematology negative hematology ROS (+)   Anesthesia Other Findings   Reproductive/Obstetrics                             Anesthesia Physical Anesthesia Plan  ASA: II  Anesthesia Plan: General   Post-op Pain Management:    Induction:   PONV Risk Score and Plan:   Airway Management Planned:   Additional Equipment:   Intra-op Plan:   Post-operative Plan:   Informed Consent: I have reviewed the patients History and Physical, chart, labs and discussed the procedure including the risks, benefits and alternatives for the proposed anesthesia with the patient or authorized representative who has indicated his/her understanding and acceptance.       Plan Discussed with:   Anesthesia Plan Comments:         Anesthesia Quick Evaluation

## 2020-11-17 ENCOUNTER — Encounter: Payer: Self-pay | Admitting: Gastroenterology

## 2020-11-17 LAB — SURGICAL PATHOLOGY

## 2020-11-18 ENCOUNTER — Encounter: Payer: Self-pay | Admitting: Gastroenterology

## 2020-12-15 DIAGNOSIS — Z20822 Contact with and (suspected) exposure to covid-19: Secondary | ICD-10-CM | POA: Diagnosis not present

## 2021-03-19 ENCOUNTER — Ambulatory Visit: Payer: BC Managed Care – PPO | Admitting: Physician Assistant

## 2021-03-19 ENCOUNTER — Ambulatory Visit
Admission: RE | Admit: 2021-03-19 | Discharge: 2021-03-19 | Disposition: A | Discharge status: Home / Self Care | Payer: BC Managed Care – PPO | Source: Ambulatory Visit | Attending: Physician Assistant | Admitting: Physician Assistant

## 2021-03-19 ENCOUNTER — Other Ambulatory Visit: Payer: Self-pay

## 2021-03-19 ENCOUNTER — Ambulatory Visit
Admission: RE | Admit: 2021-03-19 | Discharge: 2021-03-19 | Disposition: A | Discharge status: Home / Self Care | Payer: BC Managed Care – PPO | Attending: Physician Assistant | Admitting: Physician Assistant

## 2021-03-19 ENCOUNTER — Encounter: Payer: Self-pay | Admitting: Physician Assistant

## 2021-03-19 VITALS — BP 132/80 | HR 80 | Temp 98.4°F | Resp 16 | Ht 70.0 in | Wt 286.4 lb

## 2021-03-19 DIAGNOSIS — M19021 Primary osteoarthritis, right elbow: Secondary | ICD-10-CM | POA: Diagnosis not present

## 2021-03-19 DIAGNOSIS — M545 Low back pain, unspecified: Secondary | ICD-10-CM

## 2021-03-19 DIAGNOSIS — M5416 Radiculopathy, lumbar region: Secondary | ICD-10-CM

## 2021-03-19 DIAGNOSIS — M25529 Pain in unspecified elbow: Secondary | ICD-10-CM | POA: Diagnosis not present

## 2021-03-19 DIAGNOSIS — R739 Hyperglycemia, unspecified: Secondary | ICD-10-CM

## 2021-03-19 DIAGNOSIS — G8929 Other chronic pain: Secondary | ICD-10-CM

## 2021-03-19 DIAGNOSIS — Z23 Encounter for immunization: Secondary | ICD-10-CM

## 2021-03-19 DIAGNOSIS — M25562 Pain in left knee: Secondary | ICD-10-CM | POA: Diagnosis not present

## 2021-03-19 LAB — POCT GLYCOSYLATED HEMOGLOBIN (HGB A1C): Hemoglobin A1C: 5.3 % (ref 4.0–5.6)

## 2021-03-19 MED ORDER — GABAPENTIN 300 MG PO CAPS: 300.0000 mg | ORAL_CAPSULE | Freq: Three times a day (TID) | ORAL | 1 refills | Status: DC

## 2021-03-19 NOTE — Patient Instructions (Signed)
Radicular Pain Radicular pain is a type of pain that spreads from your back or neck along a spinal nerve. Spinal nerves are nerves that leave the spinal cord and go to the muscles. Radicular pain is sometimes called radiculopathy, radiculitis, or a pinched nerve. When you have this type of pain, you may also have weakness, numbness, or tingling in the area of your body that is supplied by the nerve. The pain may feel sharp and burning. Depending on which spinal nerve is affected, the pain may occur in the:  Neck area (cervical radicular pain). You may also feel pain, numbness, weakness, or tingling in the arms.  Mid-spine area (thoracic radicular pain). You would feel this pain in the back and chest. This type is rare.  Lower back area (lumbar radicular pain). You would feel this pain as low back pain. You may feel pain, numbness, weakness, or tingling in the buttocks or legs. Sciatica is a type of lumbar radicular pain that shoots down the back of the leg. Radicular pain occurs when one of the spinal nerves becomes irritated or squeezed (compressed). It is often caused by something pushing on a spinal nerve, such as one of the bones of the spine (vertebrae) or one of the round cushions between vertebrae (intervertebral disks). This can result from:  An injury.  Wear and tear or aging of a disk.  The growth of a bone spur that pushes on the nerve. Radicular pain often goes away when you follow instructions from your health care provider for relieving pain at home. Follow these instructions at home: Managing pain  If directed, put ice on the affected area: ? Put ice in a plastic bag. ? Place a towel between your skin and the bag. ? Leave the ice on for 20 minutes, 2-3 times a day.  If directed, apply heat to the affected area as often as told by your health care provider. Use the heat source that your health care provider recommends, such as a moist heat pack or a heating pad. ? Place a towel  between your skin and the heat source. ? Leave the heat on for 20-30 minutes. ? Remove the heat if your skin turns bright red. This is especially important if you are unable to feel pain, heat, or cold. You may have a greater risk of getting burned.      Activity  Do not sit or rest in bed for long periods of time.  Try to stay as active as possible. Ask your health care provider what type of exercise or activity is best for you.  Avoid activities that make your pain worse, such as bending and lifting.  Do not lift anything that is heavier than 10 lb (4.5 kg), or the limit that you are told, until your health care provider says that it is safe.  Practice using proper technique when lifting items. Proper lifting technique involves bending your knees and rising up.  Do strength and range-of-motion exercises only as told by your health care provider or physical therapist.   General instructions  Take over-the-counter and prescription medicines only as told by your health care provider.  Pay attention to any changes in your symptoms.  Keep all follow-up visits as told by your health care provider. This is important. ? Your health care provider may send you to a physical therapist to help with this pain. Contact a health care provider if:  Your pain and other symptoms get worse.  Your pain medicine   is not helping.  Your pain has not improved after a few weeks of home care.  You have a fever. Get help right away if:  You have severe pain, weakness, or numbness.  You have difficulty with bladder or bowel control. Summary  Radicular pain is a type of pain that spreads from your back or neck along a spinal nerve.  When you have radicular pain, you may also have weakness, numbness, or tingling in the area of your body that is supplied by the nerve.  The pain may feel sharp or burning.  Radicular pain may be treated with ice, heat, medicines, or physical therapy. This  information is not intended to replace advice given to you by your health care provider. Make sure you discuss any questions you have with your health care provider. Document Revised: 06/26/2018 Document Reviewed: 06/26/2018 Elsevier Patient Education  2021 Elsevier Inc.  

## 2021-03-23 NOTE — Progress Notes (Signed)
Established patient visit   Patient: Nathan [name]   DOB: 1976-04-17   45 y.o. Male  MRN: [id number] Visit Date: 03/19/2021  Today's healthcare provider: Trinna Post, PA-C   Chief Complaint  Patient presents with  . Back Pain    Onset 3 weeks unknown trauma getting worse  . Hip Pain    Left radiates down leg causing numbness    . Elbow Pain    Bilateral    Subjective    HPI HPI    Back Pain    Comments: Onset 3 weeks unknown trauma getting worse          Hip Pain    Comments: Left radiates down leg causing numbness            Elbow Pain    Comments: Bilateral        Last edited by Cathrine Muster, [place] on 03/19/2021  2:05 PM. (History)      Patient with history of lumbar DDD presenting today with low back pain that radiates down his entire leg and is associated with numbness. This pain has been present for years. He denies weakness or falling. He also reports bilateral elbow pain and he reports bilateral knee pain. He works an Clinical biochemist.   MRI 2006 Results:  RESULT:     Multiplanar/multisequence of the lumbar spine is obtained.  L5-S1  central disc protrusion is present. It is small. There is no evidence of  spinal stenosis or other focal abnormality.  The lumbar cord is normal. No  bony lesions are identified.   IMPRESSION:   1)Small L5-S1 central disc protrusion.  The lumbar vertebrae are numbered  with the lowest full size segmented vertebra as L5.   2)Multilevel disc degeneration.  There is no evidence of significant  thecal  sac deformity or spinal stenosis.           Medications: Outpatient Medications Prior to Visit  Medication Sig  . celecoxib (CELEBREX) 100 MG capsule Take 1 capsule (100 mg total) by mouth 2 (two) times daily as needed.  . [DISCONTINUED] gabapentin (NEURONTIN) 300 MG capsule Take 300 mg by mouth daily.  . [DISCONTINUED] pantoprazole (PROTONIX) 20 MG tablet Take 1 tablet (20 mg total) by mouth daily.   No  facility-administered medications prior to visit.    Review of Systems  All other systems reviewed and are negative.      Objective    BP 132/80   Pulse 80   Temp 98.4 F (36.9 C)   Resp 16   Ht 5\' 10"  (1.778 m)   Wt 286 lb 6.4 oz (129.9 kg)   SpO2 97%   BMI 41.09 kg/m     Physical Exam Constitutional:      Appearance: Normal appearance.  Cardiovascular:     Rate and Rhythm: Normal rate and regular rhythm.     Heart sounds: Normal heart sounds.  Pulmonary:     Effort: Pulmonary effort is normal.     Breath sounds: Normal breath sounds.  Skin:    General: Skin is warm and dry.  Neurological:     Mental Status: He is alert and oriented to person, place, and time. Mental status is at baseline.  Psychiatric:        Mood and Affect: Mood normal.        Behavior: Behavior normal.       Results for orders placed or performed in visit on 03/19/21  POCT HgB A1C  Result Value Ref Range   Hemoglobin A1C 5.3 4.0 - 5.6 %   HbA1c POC (<> result, manual entry)     HbA1c, POC (prediabetic range)     HbA1c, POC (controlled diabetic range)      Assessment & Plan     1. Lumbar radiculopathy  Treat as below, refresh imaging and refer to physiatry. Counseled on stepwise treatment of issue including medications, PT, injections and surgery.   - gabapentin (NEURONTIN) 300 MG capsule; Take 1 capsule (300 mg total) by mouth 3 (three) times daily.  Dispense: 90 capsule; Refill: 1 - Ambulatory referral to Orthopedics  2. Need for Tdap vaccination  - Tdap vaccine greater than or equal to 7yo IM  3. Chronic midline low back pain without sciatica  - DG Lumbar Spine Complete; Future  4. Chronic elbow pain, unspecified laterality  - DG Knee Complete 4 Views Left; Future - DG Elbow Complete Right; Future  5. Hyperglycemia  - POCT HgB A1C  Return if symptoms worsen or fail to improve.         Trinna Post, PA-C  [facility] [phone]  (phone) [phone] (fax)  [place]

## 2021-03-24 ENCOUNTER — Encounter: Payer: Self-pay | Admitting: Physician Assistant

## 2021-07-04 DIAGNOSIS — Z20822 Contact with and (suspected) exposure to covid-19: Secondary | ICD-10-CM | POA: Diagnosis not present

## 2021-07-04 DIAGNOSIS — U071 COVID-19: Secondary | ICD-10-CM | POA: Diagnosis not present

## 2021-07-29 ENCOUNTER — Other Ambulatory Visit: Payer: Self-pay

## 2021-07-29 ENCOUNTER — Ambulatory Visit: Payer: BC Managed Care – PPO | Admitting: Unknown Physician Specialty

## 2021-07-29 ENCOUNTER — Encounter: Payer: Self-pay | Admitting: Unknown Physician Specialty

## 2021-07-29 VITALS — BP 126/84 | HR 98 | Temp 98.1°F | Resp 18 | Ht 70.0 in | Wt 291.2 lb

## 2021-07-29 DIAGNOSIS — Z5181 Encounter for therapeutic drug level monitoring: Secondary | ICD-10-CM | POA: Diagnosis not present

## 2021-07-29 DIAGNOSIS — M7750 Other enthesopathy of unspecified foot: Secondary | ICD-10-CM | POA: Diagnosis not present

## 2021-07-29 DIAGNOSIS — M5136 Other intervertebral disc degeneration, lumbar region: Secondary | ICD-10-CM | POA: Insufficient documentation

## 2021-07-29 DIAGNOSIS — M5126 Other intervertebral disc displacement, lumbar region: Secondary | ICD-10-CM | POA: Diagnosis not present

## 2021-07-29 DIAGNOSIS — M5416 Radiculopathy, lumbar region: Secondary | ICD-10-CM

## 2021-07-29 MED ORDER — GABAPENTIN 300 MG PO CAPS: 600.0000 mg | ORAL_CAPSULE | Freq: Three times a day (TID) | ORAL | 1 refills | Status: DC

## 2021-07-29 MED ORDER — NAPROXEN 500 MG PO TABS: 500.0000 mg | ORAL_TABLET | Freq: Two times a day (BID) | ORAL | 1 refills | Status: DC

## 2021-07-29 NOTE — Assessment & Plan Note (Addendum)
Switch to Naprosyn per patient request.  Pt ed on GI, kidney and CV effects.  Will check CMP today.  Refusing PT or exercises for plantar fascitis.

## 2021-07-29 NOTE — Assessment & Plan Note (Signed)
Pt with lumbar disc bulging disc.  Gabapentin helps some.  Increase Gabapentin to 600 mg TID.  Titrate up gradually

## 2021-07-29 NOTE — Progress Notes (Signed)
BP 126/84   Pulse 98   Temp 98.1 F (36.7 C) (Oral)   Resp 18   Ht '5\' 10"'$  (1.778 m)   Wt 291 lb 3.2 oz (132.1 kg)   SpO2 97%   BMI 41.78 kg/m    Subjective:    [name], male    DOB: 04/05/1976, 45 y.o.   MRN: [id number]  HPI: Nathan [name] is a 45 y.o. male  Chief Complaint  Patient presents with   Medication Refill   Pt is taking both Celebrex and Gabapentin.  Celebrex is for his feet and Gabapentin is for his back.  States he has been out of his medications.  He took some of his wife's Naprosyn and had a significant improvement.  He wonders if he could take that.  Also his wife's Gabapentin, a higher dose, seemed to work better  The 10-year ASCVD risk score Mikey Bussing DC Brooke Bonito., et al., 2013) is: 3.8%   Values used to calculate the score:     Age: 7 years     Sex: Male     Is Non-Hispanic African American: No     Diabetic: No     Tobacco smoker: No     Systolic Blood Pressure: 123XX123 mmHg     Is BP treated: No     HDL Cholesterol: 29 mg/dL     Total Cholesterol: 177 mg/dL   Relevant past medical, surgical, family and social history reviewed and updated as indicated. Interim medical history since our last visit reviewed. Allergies and medications reviewed and updated.  Review of Systems  Per HPI unless specifically indicated above     Objective:    BP 126/84   Pulse 98   Temp 98.1 F (36.7 C) (Oral)   Resp 18   Ht '5\' 10"'$  (1.778 m)   Wt 291 lb 3.2 oz (132.1 kg)   SpO2 97%   BMI 41.78 kg/m   Wt Readings from Last 3 Encounters:  07/29/21 291 lb 3.2 oz (132.1 kg)  03/19/21 286 lb 6.4 oz (129.9 kg)  11/16/20 282 lb (127.9 kg)    Physical Exam Constitutional:      General: He is not in acute distress.    Appearance: Normal appearance. He is well-developed.  HENT:     Head: Normocephalic and atraumatic.  Eyes:     General: Lids are normal. No scleral icterus.       Right eye: No discharge.        Left eye: No discharge.      Conjunctiva/sclera: Conjunctivae normal.  Neck:     Vascular: No carotid bruit or JVD.  Cardiovascular:     Rate and Rhythm: Normal rate and regular rhythm.     Heart sounds: Normal heart sounds.  Pulmonary:     Effort: Pulmonary effort is normal. No respiratory distress.     Breath sounds: Normal breath sounds.  Abdominal:     Palpations: There is no hepatomegaly or splenomegaly.  Musculoskeletal:        General: Normal range of motion.     Cervical back: Normal range of motion and neck supple.  Skin:    General: Skin is warm and dry.     Coloration: Skin is not pale.     Findings: No rash.  Neurological:     Mental Status: He is alert and oriented to person, place, and time.  Psychiatric:        Behavior: Behavior normal.  Thought Content: Thought content normal.        Judgment: Judgment normal.    Results for orders placed or performed in visit on 03/19/21  POCT HgB A1C  Result Value Ref Range   Hemoglobin A1C 5.3 4.0 - 5.6 %   HbA1c POC (<> result, manual entry)     HbA1c, POC (prediabetic range)     HbA1c, POC (controlled diabetic range)        Assessment & Plan:   Problem List Items Addressed This Visit       Unprioritized   Bone spur of foot (Chronic)    Switch to Naprosyn per patient request.  Pt ed on GI, kidney and CV effects.  Will check CMP today.  Refusing PT or exercises for plantar fascitis.         Bulging lumbar disc    Pt with lumbar disc bulging disc.  Gabapentin helps some.  Increase Gabapentin to 600 mg TID.  Titrate up gradually       Other Visit Diagnoses     Medication monitoring encounter    -  Primary   Relevant Orders   COMPLETE METABOLIC PANEL WITH GFR   Lumbar radiculopathy       Relevant Medications   gabapentin (NEURONTIN) 300 MG capsule        Follow up plan: Return in about 6 months (around 01/29/2022) for physical.

## 2021-07-30 LAB — COMPLETE METABOLIC PANEL WITH GFR
AG Ratio: 2 (calc) (ref 1.0–2.5)
ALT: 25 U/L (ref 9–46)
AST: 23 U/L (ref 10–40)
Albumin: 4.4 g/dL (ref 3.6–5.1)
Alkaline phosphatase (APISO): 66 U/L (ref 36–130)
BUN: 14 mg/dL (ref 7–25)
CO2: 28 mmol/L (ref 20–32)
Calcium: 9.2 mg/dL (ref 8.6–10.3)
Chloride: 104 mmol/L (ref 98–110)
Creat: 1.06 mg/dL (ref 0.60–1.29)
Globulin: 2.2 g/dL (calc) (ref 1.9–3.7)
Glucose, Bld: 75 mg/dL (ref 65–99)
Potassium: 4.3 mmol/L (ref 3.5–5.3)
Sodium: 139 mmol/L (ref 135–146)
Total Bilirubin: 0.6 mg/dL (ref 0.2–1.2)
Total Protein: 6.6 g/dL (ref 6.1–8.1)
eGFR: 87 mL/min/{1.73_m2} (ref >=60)

## 2021-08-02 ENCOUNTER — Other Ambulatory Visit: Payer: Self-pay

## 2021-08-02 ENCOUNTER — Ambulatory Visit
Admission: EM | Admit: 2021-08-02 | Discharge: 2021-08-02 | Disposition: A | Discharge status: Home / Self Care | Payer: BC Managed Care – PPO | Attending: Emergency Medicine | Admitting: Emergency Medicine

## 2021-08-02 DIAGNOSIS — S39012A Strain of muscle, fascia and tendon of lower back, initial encounter: Secondary | ICD-10-CM

## 2021-08-02 MED ORDER — PREDNISONE 10 MG (21) PO TBPK: ORAL_TABLET | ORAL | 0 refills | Status: DC

## 2021-08-02 MED ORDER — BACLOFEN 10 MG PO TABS: 10.0000 mg | ORAL_TABLET | Freq: Three times a day (TID) | ORAL | 0 refills | Status: DC

## 2021-08-02 MED ORDER — HYDROCODONE-ACETAMINOPHEN 5-325 MG PO TABS
1.0000 | ORAL_TABLET | Freq: Four times a day (QID) | ORAL | 0 refills | Status: DC | PRN
Start: 2021-08-02 — End: 2021-11-17

## 2021-08-02 NOTE — Discharge Instructions (Addendum)
Starting tomorrow morning take the prednisone with breakfast.  You will take it each morning at breakfast for the period of 6 days.  Do not take this with Naprosyn.  Take the baclofen, 10 mg every 8 hours, on a schedule for the next 48 hours and then as needed.  Use the Norco every 6 hours as needed for severe pain.  Apply moist heat to your back for 30 minutes at a time 2-3 times a day to improve blood flow to the area and help remove the lactic acid causing the spasm.  Follow the back exercises given at discharge.  Return for reevaluation for any new or worsening symptoms.

## 2021-08-02 NOTE — ED Triage Notes (Signed)
Pt c/o lower back pain, increasing over the past week and radiating up into his ribs. Pt states he was bending over and felt a sharp pain. Pt has pain with movement/ambulation. Pt does take gabapentin and naproxen for foot pain, these have not helped his back pain.

## 2021-08-02 NOTE — ED Provider Notes (Signed)
[facility]    CSN: [id number] Arrival date & time: 08/02/21  1810      History   Chief Complaint Chief Complaint  Patient presents with   Back Pain    HPI Nathan [name] is a 45 y.o. male.   HPI  45 year old male here for evaluation of low back pain.  Patient reports that he has had low back pain on the lower left side for the past week.  He denies any injury or falls associated with the onset of this pain but he does report that he had a busy week last week when the symptoms started.  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  He has been using gabapentin and naproxen, which she uses for his bone spurs, and he has not had any relief of his symptoms.  He denies any numbness, tingling, or weakness in his lower extremities.  Rotation does increase the pain as well as lateral flexion to the right.  Past Medical History:  Diagnosis Date   GERD (gastroesophageal reflux disease)    OSA (obstructive sleep apnea)     Patient Active Problem List   Diagnosis Date Noted   Bulging lumbar disc 07/29/2021   Personal history of colonic polyps    Elevated BP without diagnosis of hypertension 10/28/2020   Vitamin D deficiency 01/15/2019   Bone spur of foot 03/16/2018   Hemorrhoid 03/16/2018   Family history of colon cancer    GERD (gastroesophageal reflux disease) 09/05/2017   Bilateral elbow joint pain 11/23/2016   Obstructive sleep apnea on CPAP 11/12/2015   Morbid obesity ([place]) 11/12/2015   Syncope and collapse 09/24/2015   Snoring 09/24/2015   Abnormal EKG 09/24/2015    Past Surgical History:  Procedure Laterality Date   COLONOSCOPY WITH PROPOFOL N/A 10/20/2017   Procedure: COLONOSCOPY WITH PROPOFOL;  Surgeon: Lucilla Lame, MD;  Location: [place];  Service: Endoscopy;  Laterality: N/A;   COLONOSCOPY WITH PROPOFOL N/A 11/16/2020   Procedure: COLONOSCOPY WITH BIOPSY;  Surgeon: Lucilla Lame, MD;  Location: [place];  Service: Endoscopy;  Laterality: N/A;  Sleep apnea priority 4   ESOPHAGOGASTRODUODENOSCOPY (EGD) WITH PROPOFOL N/A 10/20/2017   Procedure: ESOPHAGOGASTRODUODENOSCOPY (EGD) WITH PROPOFOL;  Surgeon: Lucilla Lame, MD;  Location: [place];  Service: Endoscopy;  Laterality: N/A;  sleep apnea   GALLBLADDER SURGERY     POLYPECTOMY N/A 11/16/2020   Procedure: POLYPECTOMY;  Surgeon: Lucilla Lame, MD;  Location: [place];  Service: Endoscopy;  Laterality: N/A;       Home Medications    Prior to Admission medications   Medication Sig Start Date End Date Taking? Authorizing Provider  baclofen (LIORESAL) 10 MG tablet Take 1 tablet (10 mg total) by mouth 3 (three) times daily. 08/02/21  Yes Margarette Canada, NP  gabapentin (NEURONTIN) 300 MG capsule Take 2 capsules (600 mg total) by mouth 3 (three) times daily. 07/29/21  Yes Kathrine Haddock, NP  HYDROcodone-acetaminophen (NORCO/VICODIN) 5-325 MG tablet Take 1 tablet by mouth every 6 (six) hours as needed. 08/02/21  Yes Margarette Canada, NP  naproxen (NAPROSYN) 500 MG tablet Take 1 tablet (500 mg total) by mouth 2 (two) times daily with a meal. 07/29/21  Yes Kathrine Haddock, NP  predniSONE (STERAPRED UNI-PAK 21 TAB) 10 MG (21) TBPK tablet Take 6 tablets on day 1, 5 tablets day 2, 4 tablets day 3, 3 tablets day 4, 2 tablets  day 5, 1 tablet day 6 08/02/21  Yes Margarette Canada, NP    Family History Family History  Problem Relation Age of Onset   Cancer Mother    Stroke Father    Heart disease Father    Diabetes Father     Social History Social History   Tobacco Use   Smoking status: Former    Packs/day: 1.00    Years: 16.00    Pack years: 16.00    Types: Cigarettes    Quit date: 2008    Years since quitting: 14.6   Smokeless tobacco: Current    Types: Snuff, Chew  Vaping Use   Vaping Use: Never used  Substance Use Topics   Alcohol use: No   Drug use: No     Allergies   Patient has no  known allergies.   Review of Systems Review of Systems  Constitutional:  Negative for activity change, appetite change and fever.  Musculoskeletal:  Positive for back pain and myalgias. Negative for arthralgias.  Skin:  Negative for color change.  Neurological:  Negative for weakness and numbness.  Hematological: Negative.   Psychiatric/Behavioral: Negative.      Physical Exam Triage Vital Signs ED Triage Vitals  Enc Vitals Group     BP 08/02/21 1836 135/87     Pulse Rate 08/02/21 1836 87     Resp 08/02/21 1836 18     Temp 08/02/21 1836 98.5 F (36.9 C)     Temp Source 08/02/21 1836 Oral     SpO2 08/02/21 1836 99 %     Weight 08/02/21 1834 291 lb (132 kg)     Height 08/02/21 1834 5' 10.5" (1.791 m)     Head Circumference --      Peak Flow --      Pain Score 08/02/21 1834 9     Pain Loc --      Pain Edu? --      Excl. in [place]? --    No data found.  Updated Vital Signs BP 135/87 (BP Location: Left Arm)   Pulse 87   Temp 98.5 F (36.9 C) (Oral)   Resp 18   Ht 5' 10.5" (1.791 m)   Wt 291 lb (132 kg)   SpO2 99%   BMI 41.16 kg/m   Visual Acuity Right Eye Distance:   Left Eye Distance:   Bilateral Distance:    Right Eye Near:   Left Eye Near:    Bilateral Near:     Physical Exam Vitals and nursing note reviewed.  Constitutional:      General: He is not in acute distress.    Appearance: Normal appearance. He is obese. He is not ill-appearing.  HENT:     Head: Normocephalic and atraumatic.  Musculoskeletal:        General: Tenderness present. No swelling or deformity.  Skin:    General: Skin is warm and dry.     Capillary Refill: Capillary refill takes less than 2 seconds.     Findings: No bruising or erythema.  Neurological:     General: No focal deficit present.     Mental Status: He is alert and oriented to person, place, and time.     Sensory: No sensory deficit.     Motor: No weakness.     Deep Tendon Reflexes: Reflexes normal.  Psychiatric:         Mood and Affect: Mood normal.        Behavior: Behavior normal.  Thought Content: Thought content normal.        Judgment: Judgment normal.     UC Treatments / Results  Labs (all labs ordered are listed, but only abnormal results are displayed) Labs Reviewed - No data to display  EKG   Radiology No results found.  Procedures Procedures (including critical care time)  Medications Ordered in UC Medications - No data to display  Initial Impression / Assessment and Plan / UC Course  I have reviewed the triage vital signs and the nursing notes.  Pertinent labs & imaging results that were available during my care of the patient were reviewed by me and considered in my medical decision making (see chart for details).  Patient is a nontoxic-appearing 45 year old male who does appear to be in a moderate degree of pain here for evaluation of left-sided low back pain that started 1 week ago.  His pain has not been responding to gabapentin and Naprosyn at home.  He has no known history of low back pain and he denies any injury or falls associated with this.  He has no numbness, tingling, or weakness in his lower extremities and he has no loss of bowel or bladder control.  Patient's physical exam reveals no midline spinous tenderness in his thoracic or lumbar spine.  There is moderate tenderness and spasm in the left lumbar paraspinous region.  There is no tenderness or spasm in the right paraspinous region.  Patient's bilateral lower extremity strength is 5/5.  Patient's pain does increase with lateral rotation and right lateral flexion.  Patient has negative straight leg raise on the left but has a contralateral straight leg raise on the right.  Patient's exam is consistent with lumbar strain and will treat with prednisone pack, baclofen, and will give Norco to bridge for tonight.   Final Clinical Impressions(s) / UC Diagnoses   Final diagnoses:  Strain of lumbar region, initial  encounter     Discharge Instructions      Starting tomorrow morning take the prednisone with breakfast.  You will take it each morning at breakfast for the period of 6 days.  Do not take this with Naprosyn.  Take the baclofen, 10 mg every 8 hours, on a schedule for the next 48 hours and then as needed.  Use the Norco every 6 hours as needed for severe pain.  Apply moist heat to your back for 30 minutes at a time 2-3 times a day to improve blood flow to the area and help remove the lactic acid causing the spasm.  Follow the back exercises given at discharge.  Return for reevaluation for any new or worsening symptoms.        ED Prescriptions     Medication Sig Dispense Auth. Provider   predniSONE (STERAPRED UNI-PAK 21 TAB) 10 MG (21) TBPK tablet Take 6 tablets on day 1, 5 tablets day 2, 4 tablets day 3, 3 tablets day 4, 2 tablets day 5, 1 tablet day 6 21 tablet Margarette Canada, NP   HYDROcodone-acetaminophen (NORCO/VICODIN) 5-325 MG tablet Take 1 tablet by mouth every 6 (six) hours as needed. 10 tablet Margarette Canada, NP   baclofen (LIORESAL) 10 MG tablet Take 1 tablet (10 mg total) by mouth 3 (three) times daily. 23 each Margarette Canada, NP      I have reviewed the PDMP during this encounter.   Margarette Canada, NP 08/02/21 1907

## 2021-08-11 DIAGNOSIS — M47814 Spondylosis without myelopathy or radiculopathy, thoracic region: Secondary | ICD-10-CM | POA: Diagnosis not present

## 2021-08-11 DIAGNOSIS — M546 Pain in thoracic spine: Secondary | ICD-10-CM | POA: Diagnosis not present

## 2021-08-11 DIAGNOSIS — M5136 Other intervertebral disc degeneration, lumbar region: Secondary | ICD-10-CM | POA: Diagnosis not present

## 2021-08-11 DIAGNOSIS — X509XXA Other and unspecified overexertion or strenuous movements or postures, initial encounter: Secondary | ICD-10-CM | POA: Diagnosis not present

## 2021-08-11 DIAGNOSIS — M47816 Spondylosis without myelopathy or radiculopathy, lumbar region: Secondary | ICD-10-CM | POA: Diagnosis not present

## 2021-08-11 DIAGNOSIS — X500XXA Overexertion from strenuous movement or load, initial encounter: Secondary | ICD-10-CM | POA: Diagnosis not present

## 2021-08-11 DIAGNOSIS — M549 Dorsalgia, unspecified: Secondary | ICD-10-CM | POA: Diagnosis not present

## 2021-08-11 DIAGNOSIS — M545 Low back pain, unspecified: Secondary | ICD-10-CM | POA: Diagnosis not present

## 2021-08-11 DIAGNOSIS — K219 Gastro-esophageal reflux disease without esophagitis: Secondary | ICD-10-CM | POA: Diagnosis not present

## 2021-08-11 DIAGNOSIS — Z5329 Procedure and treatment not carried out because of patient's decision for other reasons: Secondary | ICD-10-CM | POA: Diagnosis not present

## 2021-08-11 DIAGNOSIS — M5459 Other low back pain: Secondary | ICD-10-CM | POA: Diagnosis not present

## 2021-08-12 DIAGNOSIS — M549 Dorsalgia, unspecified: Secondary | ICD-10-CM | POA: Diagnosis not present

## 2021-08-12 DIAGNOSIS — M47814 Spondylosis without myelopathy or radiculopathy, thoracic region: Secondary | ICD-10-CM | POA: Diagnosis not present

## 2021-11-17 ENCOUNTER — Ambulatory Visit: Payer: Self-pay | Admitting: *Deleted

## 2021-11-17 ENCOUNTER — Encounter: Payer: Self-pay | Admitting: Emergency Medicine

## 2021-11-17 ENCOUNTER — Other Ambulatory Visit: Payer: Self-pay

## 2021-11-17 ENCOUNTER — Ambulatory Visit
Admission: EM | Admit: 2021-11-17 | Discharge: 2021-11-17 | Disposition: A | Discharge status: Home / Self Care | Payer: BC Managed Care – PPO

## 2021-11-17 DIAGNOSIS — H66003 Acute suppurative otitis media without spontaneous rupture of ear drum, bilateral: Secondary | ICD-10-CM

## 2021-11-17 DIAGNOSIS — J069 Acute upper respiratory infection, unspecified: Secondary | ICD-10-CM

## 2021-11-17 MED ORDER — PROMETHAZINE-DM 6.25-15 MG/5ML PO SYRP: 5.0000 mL | ORAL_SOLUTION | Freq: Four times a day (QID) | ORAL | 0 refills | Status: DC | PRN

## 2021-11-17 MED ORDER — BENZONATATE 100 MG PO CAPS: 200.0000 mg | ORAL_CAPSULE | Freq: Three times a day (TID) | ORAL | 0 refills | Status: DC

## 2021-11-17 MED ORDER — IPRATROPIUM BROMIDE 0.06 % NA SOLN: 2.0000 | Freq: Four times a day (QID) | NASAL | 12 refills | Status: DC

## 2021-11-17 MED ORDER — AMOXICILLIN-POT CLAVULANATE 875-125 MG PO TABS: 1.0000 | ORAL_TABLET | Freq: Two times a day (BID) | ORAL | 0 refills | Status: AC

## 2021-11-17 MED ORDER — ACETAMINOPHEN 500 MG PO TABS
1000.0000 mg | ORAL_TABLET | Freq: Once | ORAL | Status: AC
  Administered 2021-11-17: 1000 mg via ORAL

## 2021-11-17 NOTE — Discharge Instructions (Signed)
Take the Augmentin twice daily for 10 days with food for treatment of your ear infection.  Take an over-the-counter probiotic 1 hour after each dose of antibiotic to prevent diarrhea.  Use over-the-counter Tylenol and ibuprofen as needed for pain or fever.  Place a hot water bottle, or heating pad, underneath your pillowcase at night to help dilate up your ear and aid in pain relief as well as resolution of the infection.  Use the Atrovent nasal spray, 2 squirts in each nostril every 6 hours, as needed for runny nose and postnasal drip.  Use the Tessalon Perles every 8 hours during the day.  Take them with a small sip of water.  They may give you some numbness to the base of your tongue or a metallic taste in your mouth, this is normal.  Use the Promethazine DM cough syrup at bedtime for cough and congestion.  It will make you drowsy so do not take it during the day.  Use OTC Tylenol and Ibuprofen as needed for fever and pain.  Return for reevaluation or see your primary care provider for any new or worsening symptoms.

## 2021-11-17 NOTE — Telephone Encounter (Signed)
Patient's wife is calling- she is requesting an appointment- husband is at [place] waiting to be seen.  Call to patient- patient states she has sinus pressure and cough causing chest pain and tightness. Patient states when he coughs it feels like he is pulling his whole chest out. Patient states he is also traveling by plane tomorrow. Patient advised to stay and be seen- he states he will- the wait is 2 hours at this point. Reason for Disposition  [1] Follow-up call to recent contact AND [2] information only call, no triage required  Answer Assessment - Initial Assessment Questions 1. REASON FOR CALL or QUESTION: "What is your reason for calling today?" or "How can I best help you?" or "What question do you have that I can help answer?"     Patient is at [facility]- called to see if he could be seen in office today. Patient advised to stay at [facility] and be seen there. He states he will stay.  Protocols used: Information Only Call - No Triage-A-AH

## 2021-11-17 NOTE — ED Provider Notes (Signed)
[facility]    CSN: [id number] Arrival date & time: 11/17/21  1546      History   Chief Complaint Chief Complaint  Patient presents with   Otalgia   Cough    HPI BERTIS HUSTEAD is a 45 y.o. male.   HPI  45 year old male here for evaluation of respiratory complaints.  Patient reports that he has been experiencing runny nose nasal congestion and bilateral ear pain for the past week.  This is in conjunction with green nasal discharge, cough, shortness of breath, and wheezing.  He denies fever, sore throat, or GI complaints.  Past Medical History:  Diagnosis Date   GERD (gastroesophageal reflux disease)    OSA (obstructive sleep apnea)     Patient Active Problem List   Diagnosis Date Noted   Bulging lumbar disc 07/29/2021   Personal history of colonic polyps    Elevated BP without diagnosis of hypertension 10/28/2020   Vitamin D deficiency 01/15/2019   Bone spur of foot 03/16/2018   Hemorrhoid 03/16/2018   Family history of colon cancer    GERD (gastroesophageal reflux disease) 09/05/2017   Bilateral elbow joint pain 11/23/2016   Obstructive sleep apnea on CPAP 11/12/2015   Morbid obesity ([place]) 11/12/2015   Syncope and collapse 09/24/2015   Snoring 09/24/2015   Abnormal EKG 09/24/2015    Past Surgical History:  Procedure Laterality Date   COLONOSCOPY WITH PROPOFOL N/A 10/20/2017   Procedure: COLONOSCOPY WITH PROPOFOL;  Surgeon: Lucilla Lame, MD;  Location: [place];  Service: Endoscopy;  Laterality: N/A;   COLONOSCOPY WITH PROPOFOL N/A 11/16/2020   Procedure: COLONOSCOPY WITH BIOPSY;  Surgeon: Lucilla Lame, MD;  Location: [place];  Service: Endoscopy;  Laterality: N/A;  Sleep apnea priority 4   ESOPHAGOGASTRODUODENOSCOPY (EGD) WITH PROPOFOL N/A 10/20/2017   Procedure: ESOPHAGOGASTRODUODENOSCOPY (EGD) WITH PROPOFOL;  Surgeon: Lucilla Lame, MD;  Location: [place];  Service: Endoscopy;  Laterality: N/A;  sleep apnea    GALLBLADDER SURGERY     POLYPECTOMY N/A 11/16/2020   Procedure: POLYPECTOMY;  Surgeon: Lucilla Lame, MD;  Location: [place];  Service: Endoscopy;  Laterality: N/A;       Home Medications    Prior to Admission medications   Medication Sig Start Date End Date Taking? Authorizing Provider  amoxicillin-clavulanate (AUGMENTIN) 875-125 MG tablet Take 1 tablet by mouth every 12 (twelve) hours for 10 days. 11/17/21 11/27/21 Yes Margarette Canada, NP  benzonatate (TESSALON) 100 MG capsule Take 2 capsules (200 mg total) by mouth every 8 (eight) hours. 11/17/21  Yes Margarette Canada, NP  gabapentin (NEURONTIN) 300 MG capsule Take 2 capsules (600 mg total) by mouth 3 (three) times daily. 07/29/21  Yes Kathrine Haddock, NP  ipratropium (ATROVENT) 0.06 % nasal spray Place 2 sprays into both nostrils 4 (four) times daily. 11/17/21  Yes Margarette Canada, NP  naproxen (NAPROSYN) 500 MG tablet Take 1 tablet (500 mg total) by mouth 2 (two) times daily with a meal. 07/29/21  Yes Kathrine Haddock, NP  omeprazole (PRILOSEC) 20 MG capsule Take 20 mg by mouth daily.   Yes [provider]  promethazine-dextromethorphan (PROMETHAZINE-DM) 6.25-15 MG/5ML syrup Take 5 mLs by mouth 4 (four) times daily as needed. 11/17/21  Yes Margarette Canada, NP    Family History Family History  Problem Relation Age of Onset   Cancer Mother    Stroke Father    Heart disease Father    Diabetes Father     Social History Social History  Tobacco Use   Smoking status: Former    Packs/day: 1.00    Years: 16.00    Pack years: 16.00    Types: Cigarettes    Quit date: 2008    Years since quitting: 14.9   Smokeless tobacco: Current    Types: Snuff, Chew  Vaping Use   Vaping Use: Never used  Substance Use Topics   Alcohol use: No   Drug use: No     Allergies   Patient has no known allergies.   Review of Systems Review of Systems  Constitutional:  Positive for fever. Negative for activity change and appetite change.   HENT:  Positive for congestion, ear pain and rhinorrhea. Negative for sore throat.   Respiratory:  Positive for cough, shortness of breath and wheezing.   Gastrointestinal:  Negative for diarrhea, nausea and vomiting.  Skin:  Negative for rash.  Neurological:  Negative for headaches.  Hematological: Negative.   Psychiatric/Behavioral: Negative.      Physical Exam Triage Vital Signs ED Triage Vitals  Enc Vitals Group     BP 11/17/21 1703 (!) 155/91     Pulse Rate 11/17/21 1703 95     Resp 11/17/21 1703 18     Temp 11/17/21 1703 (!) 101.5 F (38.6 C)     Temp Source 11/17/21 1703 Oral     SpO2 11/17/21 1703 95 %     Weight 11/17/21 1700 291 lb 0.1 oz (132 kg)     Height 11/17/21 1700 5' 10.5" (1.791 m)     Head Circumference --      Peak Flow --      Pain Score 11/17/21 1658 5     Pain Loc --      Pain Edu? --      Excl. in [place]? --    No data found.  Updated Vital Signs BP (!) 155/91 (BP Location: Right Arm)   Pulse 95   Temp (!) 101.5 F (38.6 C) (Oral)   Resp 18   Ht 5' 10.5" (1.791 m)   Wt 291 lb 0.1 oz (132 kg)   SpO2 95%   BMI 41.17 kg/m   Visual Acuity Right Eye Distance:   Left Eye Distance:   Bilateral Distance:    Right Eye Near:   Left Eye Near:    Bilateral Near:     Physical Exam Vitals and nursing note reviewed.  Constitutional:      General: He is not in acute distress.    Appearance: Normal appearance. He is not ill-appearing.  HENT:     Head: Normocephalic and atraumatic.     Right Ear: Ear canal and external ear normal. There is no impacted cerumen.     Left Ear: Ear canal and external ear normal. There is no impacted cerumen.     Nose: Congestion and rhinorrhea present.     Mouth/Throat:     Pharynx: Oropharynx is clear. Posterior oropharyngeal erythema present.  Cardiovascular:     Rate and Rhythm: Normal rate and regular rhythm.     Pulses: Normal pulses.     Heart sounds: Normal heart sounds. No murmur heard.   No gallop.   Pulmonary:     Effort: Pulmonary effort is normal.     Breath sounds: Normal breath sounds. No wheezing, rhonchi or rales.  Musculoskeletal:     Cervical back: Normal range of motion and neck supple.  Lymphadenopathy:     Cervical: No cervical adenopathy.  Skin:    General:  Skin is warm and dry.     Capillary Refill: Capillary refill takes less than 2 seconds.     Findings: No erythema or rash.  Neurological:     General: No focal deficit present.     Mental Status: He is alert and oriented to person, place, and time.  Psychiatric:        Mood and Affect: Mood normal.        Behavior: Behavior normal.        Thought Content: Thought content normal.        Judgment: Judgment normal.     UC Treatments / Results  Labs (all labs ordered are listed, but only abnormal results are displayed) Labs Reviewed - No data to display  EKG   Radiology No results found.  Procedures Procedures (including critical care time)  Medications Ordered in UC Medications  acetaminophen (TYLENOL) tablet 1,000 mg (1,000 mg Oral Given 11/17/21 1707)    Initial Impression / Assessment and Plan / UC Course  I have reviewed the triage vital signs and the nursing notes.  Pertinent labs & imaging results that were available during my care of the patient were reviewed by me and considered in my medical decision making (see chart for details).  Patient is a nontoxic-appearing 45 year old male here for evaluation of upper respiratory complaints as outlined in the HPI above.  Physical exam reveals erythematous injected tympanic membranes bilaterally.  Both external auditory canals are clear.  Nasal mucosa is erythematous and edematous with purulent nasal discharge in both nares.  No tenderness to percussion of frontal or maxillary sinuses.  Oropharyngeal exam reveals posterior oropharyngeal erythema with yellowish postnasal drip.  No cervical lymphadenopathy appreciated on exam.  Cardiopulmonary exam  reveals clear lung sounds in all fields.  Patient exam is consistent with an upper respiratory infection and bilateral otitis media.  We will treat with Augmentin twice daily for 10 days for the otitis media and upper respiratory infection.  We will also give Atrovent nasal spray to help with nasal congestion, Tessalon Perles and Promethazine DM cough syrup to help with cough and congestion.   Final Clinical Impressions(s) / UC Diagnoses   Final diagnoses:  Upper respiratory tract infection, unspecified type  Non-recurrent acute suppurative otitis media of both ears without spontaneous rupture of tympanic membranes     Discharge Instructions      Take the Augmentin twice daily for 10 days with food for treatment of your ear infection.  Take an over-the-counter probiotic 1 hour after each dose of antibiotic to prevent diarrhea.  Use over-the-counter Tylenol and ibuprofen as needed for pain or fever.  Place a hot water bottle, or heating pad, underneath your pillowcase at night to help dilate up your ear and aid in pain relief as well as resolution of the infection.  Use the Atrovent nasal spray, 2 squirts in each nostril every 6 hours, as needed for runny nose and postnasal drip.  Use the Tessalon Perles every 8 hours during the day.  Take them with a small sip of water.  They may give you some numbness to the base of your tongue or a metallic taste in your mouth, this is normal.  Use the Promethazine DM cough syrup at bedtime for cough and congestion.  It will make you drowsy so do not take it during the day.  Use OTC Tylenol and Ibuprofen as needed for fever and pain.  Return for reevaluation or see your primary care provider for any new  or worsening symptoms.      ED Prescriptions     Medication Sig Dispense Auth. Provider   amoxicillin-clavulanate (AUGMENTIN) 875-125 MG tablet Take 1 tablet by mouth every 12 (twelve) hours for 10 days. 20 tablet Margarette Canada, NP    benzonatate (TESSALON) 100 MG capsule Take 2 capsules (200 mg total) by mouth every 8 (eight) hours. 21 capsule Margarette Canada, NP   ipratropium (ATROVENT) 0.06 % nasal spray Place 2 sprays into both nostrils 4 (four) times daily. 15 mL Margarette Canada, NP   promethazine-dextromethorphan (PROMETHAZINE-DM) 6.25-15 MG/5ML syrup Take 5 mLs by mouth 4 (four) times daily as needed. 118 mL Margarette Canada, NP      PDMP not reviewed this encounter.   Margarette Canada, NP 11/17/21 [phone]

## 2021-11-17 NOTE — ED Triage Notes (Signed)
Pt c/o cough, bilateral ear pain (L>R), nasal congestion, runny nose. Started about a week ago. Denies fever. He states he has been around his brother in law that has flu just prior to him testing positive.

## 2021-12-07 ENCOUNTER — Other Ambulatory Visit: Payer: Self-pay | Admitting: Unknown Physician Specialty

## 2021-12-07 DIAGNOSIS — M5416 Radiculopathy, lumbar region: Secondary | ICD-10-CM

## 2021-12-07 NOTE — Telephone Encounter (Signed)
Requested Prescriptions  Pending Prescriptions Disp Refills   gabapentin (NEURONTIN) 300 MG capsule [Pharmacy Med Name: GABAPENTIN 300 MG CAPSULE] 180 capsule 0    Sig: TAKE 2 CAPSULES BY MOUTH 3 TIMES DAILY.     Neurology: Anticonvulsants - gabapentin Passed - 12/07/2021 11:12 AM      Passed - Valid encounter within last 12 months    Recent Outpatient Visits          4 months ago Medication monitoring encounter   [place], NP   8 months ago Lumbar radiculopathy   [facility] Trinna Post, [place]   1 year ago Annual physical exam   [facility] Towanda Malkin, MD   1 year ago Left hip pain   [place], FNP   2 years ago Obstructive sleep apnea on CPAP   [place], MD      Future Appointments            In 1 month Delsa Grana, PA-C [facility], [facility]

## 2021-12-28 ENCOUNTER — Ambulatory Visit: Payer: Self-pay | Admitting: *Deleted

## 2021-12-28 ENCOUNTER — Ambulatory Visit (INDEPENDENT_AMBULATORY_CARE_PROVIDER_SITE_OTHER): Payer: BC Managed Care – PPO

## 2021-12-28 ENCOUNTER — Other Ambulatory Visit: Payer: Self-pay

## 2021-12-28 ENCOUNTER — Ambulatory Visit
Admission: EM | Admit: 2021-12-28 | Discharge: 2021-12-28 | Disposition: A | Discharge status: Home / Self Care | Payer: BC Managed Care – PPO | Attending: Physician Assistant | Admitting: Physician Assistant

## 2021-12-28 DIAGNOSIS — R109 Unspecified abdominal pain: Secondary | ICD-10-CM | POA: Insufficient documentation

## 2021-12-28 DIAGNOSIS — R310 Gross hematuria: Secondary | ICD-10-CM

## 2021-12-28 DIAGNOSIS — J069 Acute upper respiratory infection, unspecified: Secondary | ICD-10-CM | POA: Insufficient documentation

## 2021-12-28 DIAGNOSIS — R300 Dysuria: Secondary | ICD-10-CM | POA: Insufficient documentation

## 2021-12-28 DIAGNOSIS — N10 Acute tubulo-interstitial nephritis: Secondary | ICD-10-CM | POA: Diagnosis not present

## 2021-12-28 LAB — URINALYSIS, COMPLETE (UACMP) WITH MICROSCOPIC
Glucose, UA: NEGATIVE mg/dL
Ketones, ur: 15 mg/dL — AB
Nitrite: NEGATIVE
Protein, ur: 100 mg/dL — AB
RBC / HPF: >50 RBC/hpf (ref 0–5)
Specific Gravity, Urine: 1.02 (ref 1.005–1.030)
WBC, UA: >50 WBC/hpf (ref 0–5)
pH: 6 (ref 5.0–8.0)

## 2021-12-28 MED ORDER — CIPROFLOXACIN HCL 500 MG PO TABS: 500.0000 mg | ORAL_TABLET | Freq: Two times a day (BID) | ORAL | 0 refills | Status: AC

## 2021-12-28 MED ORDER — HYDROCODONE-ACETAMINOPHEN 5-325 MG PO TABS: 1.0000 | ORAL_TABLET | Freq: Four times a day (QID) | ORAL | 0 refills | Status: AC | PRN

## 2021-12-28 NOTE — Discharge Instructions (Addendum)
-  Your urinalysis shows that you have a urinary tract infection.  Since you have flank pain and you likely have a kidney infection.  I have sent antibiotics to pharmacy.  Make sure to take them all.  Sure to increase your fluid intake. - We did a x-ray to see if you had any kidney stone.  As we discussed, there is no evidence of kidney stone on your x-ray but x-rays do not think of ever kidney stone so if the flank pain is not getting better you may need to be seen again and have a CT scan. - If the pain is not getting better, you have continued fever or more blood in the urine after the next couple of days you need to go to emergency department for CT scan. - Additionally, you should take another COVID test when he got home given your cough and congestion.  It is possible you could have COVID.  I know the first test was negative but usually is her second.  If you have COVID you need to isolate 5 days when your symptoms began and then wear a mask for 5 days.  Keep taking Mucinex.  You need to be seen again if you have any chest pain or breathing trouble.  More likely a viral illness.

## 2021-12-28 NOTE — ED Provider Notes (Signed)
[facility]    CSN: [id number] Arrival date & time: 12/28/21  1447      History   Chief Complaint Chief Complaint  Patient presents with   Dysuria   Hematuria    HPI Nathan [name] is a 46 y.o. male presenting for onset of fever, fatigue, cough, congestion and dysuria 3 days ago.  Patient says over the past day he has developed left flank pain.  Reports that he has also noticed hematuria.  States that his urine is dark and cloudy.  Patient reports that his family is sick with cough and congestion as well.  He reports that he and his wife both taken a COVID test at home and they have been negative.  He has been taking cough medicine for symptoms.  States he has not had a fever today.  Temp currently 99.2 degrees.  Denies any history of UTIs or renal stones.  Denies any similar urinary type symptoms in the past.  He does have history of sleep apnea and obesity.  No other complaints.  HPI  Past Medical History:  Diagnosis Date   GERD (gastroesophageal reflux disease)    OSA (obstructive sleep apnea)     Patient Active Problem List   Diagnosis Date Noted   Bulging lumbar disc 07/29/2021   Personal history of colonic polyps    Elevated BP without diagnosis of hypertension 10/28/2020   Vitamin D deficiency 01/15/2019   Bone spur of foot 03/16/2018   Hemorrhoid 03/16/2018   Family history of colon cancer    GERD (gastroesophageal reflux disease) 09/05/2017   Bilateral elbow joint pain 11/23/2016   Obstructive sleep apnea on CPAP 11/12/2015   Morbid obesity ([place]) 11/12/2015   Syncope and collapse 09/24/2015   Snoring 09/24/2015   Abnormal EKG 09/24/2015    Past Surgical History:  Procedure Laterality Date   COLONOSCOPY WITH PROPOFOL N/A 10/20/2017   Procedure: COLONOSCOPY WITH PROPOFOL;  Surgeon: Lucilla Lame, MD;  Location: [place];  Service: Endoscopy;  Laterality: N/A;   COLONOSCOPY WITH PROPOFOL N/A 11/16/2020   Procedure: COLONOSCOPY WITH BIOPSY;   Surgeon: Lucilla Lame, MD;  Location: [place];  Service: Endoscopy;  Laterality: N/A;  Sleep apnea priority 4   ESOPHAGOGASTRODUODENOSCOPY (EGD) WITH PROPOFOL N/A 10/20/2017   Procedure: ESOPHAGOGASTRODUODENOSCOPY (EGD) WITH PROPOFOL;  Surgeon: Lucilla Lame, MD;  Location: [place];  Service: Endoscopy;  Laterality: N/A;  sleep apnea   GALLBLADDER SURGERY     POLYPECTOMY N/A 11/16/2020   Procedure: POLYPECTOMY;  Surgeon: Lucilla Lame, MD;  Location: [place];  Service: Endoscopy;  Laterality: N/A;       Home Medications    Prior to Admission medications   Medication Sig Start Date End Date Taking? Authorizing Provider  ciprofloxacin (CIPRO) 500 MG tablet Take 1 tablet (500 mg total) by mouth 2 (two) times daily for 14 days. 12/28/21 01/11/22 Yes Danton Clap, PA-C  HYDROcodone-acetaminophen (NORCO/VICODIN) 5-325 MG tablet Take 1 tablet by mouth every 6 (six) hours as needed for up to 2 days. 12/28/21 12/30/21 Yes Danton Clap, PA-C  benzonatate (TESSALON) 100 MG capsule Take 2 capsules (200 mg total) by mouth every 8 (eight) hours. 11/17/21   Margarette Canada, NP  gabapentin (NEURONTIN) 300 MG capsule TAKE 2 CAPSULES BY MOUTH 3 TIMES DAILY. 12/07/21   Kathrine Haddock, NP  ipratropium (ATROVENT) 0.06 % nasal spray Place 2 sprays into both nostrils 4 (four) times daily. 11/17/21   Margarette Canada, NP  naproxen (NAPROSYN)  500 MG tablet Take 1 tablet (500 mg total) by mouth 2 (two) times daily with a meal. 07/29/21   Kathrine Haddock, NP  omeprazole (PRILOSEC) 20 MG capsule Take 20 mg by mouth daily.    [provider]  promethazine-dextromethorphan (PROMETHAZINE-DM) 6.25-15 MG/5ML syrup Take 5 mLs by mouth 4 (four) times daily as needed. 11/17/21   Margarette Canada, NP    Family History Family History  Problem Relation Age of Onset   Cancer Mother    Stroke Father    Heart disease Father    Diabetes Father     Social History Social History   Tobacco Use    Smoking status: Former    Packs/day: 1.00    Years: 16.00    Pack years: 16.00    Types: Cigarettes    Quit date: 2008    Years since quitting: 15.0   Smokeless tobacco: Current    Types: Snuff, Chew  Vaping Use   Vaping Use: Never used  Substance Use Topics   Alcohol use: Yes    Comment: rare   Drug use: No     Allergies   Patient has no known allergies.   Review of Systems Review of Systems  Constitutional:  Positive for fatigue and fever.  HENT:  Positive for congestion. Negative for rhinorrhea and sore throat.   Respiratory:  Positive for cough. Negative for shortness of breath and wheezing.   Cardiovascular:  Negative for chest pain.  Gastrointestinal:  Negative for abdominal pain, diarrhea, nausea and vomiting.  Genitourinary:  Positive for difficulty urinating, dysuria, flank pain and hematuria. Negative for frequency, genital sores, penile discharge, penile pain, penile swelling, scrotal swelling, testicular pain and urgency.  Musculoskeletal:  Negative for arthralgias and myalgias.  Skin:  Negative for rash.  Neurological:  Negative for weakness, light-headedness and headaches.    Physical Exam Triage Vital Signs ED Triage Vitals  Enc Vitals Group     BP 12/28/21 1602 134/90     Pulse Rate 12/28/21 1602 93     Resp 12/28/21 1602 18     Temp 12/28/21 1602 99.2 F (37.3 C)     Temp Source 12/28/21 1602 Oral     SpO2 12/28/21 1602 100 %     Weight 12/28/21 1558 286 lb (129.7 kg)     Height 12/28/21 1558 5\' 10"  (1.778 m)     Head Circumference --      Peak Flow --      Pain Score 12/28/21 1558 2     Pain Loc --      Pain Edu? --      Excl. in [place]? --    No data found.  Updated Vital Signs BP 134/90 (BP Location: Right Arm)    Pulse 93    Temp 99.2 F (37.3 C) (Oral)    Resp 18    Ht 5\' 10"  (1.778 m)    Wt 286 lb (129.7 kg)    SpO2 100%    BMI 41.04 kg/m   Physical Exam Vitals and nursing note reviewed.  Constitutional:      General: He is not in  acute distress.    Appearance: Normal appearance. He is well-developed. He is obese. He is ill-appearing.  HENT:     Head: Normocephalic and atraumatic.     Nose: Congestion present.     Mouth/Throat:     Mouth: Mucous membranes are moist.     Pharynx: Oropharynx is clear.  Eyes:  General: No scleral icterus.    Conjunctiva/sclera: Conjunctivae normal.  Cardiovascular:     Rate and Rhythm: Normal rate and regular rhythm.     Heart sounds: Normal heart sounds.  Pulmonary:     Effort: Pulmonary effort is normal. No respiratory distress.     Breath sounds: Normal breath sounds.  Abdominal:     Palpations: Abdomen is soft.     Tenderness: There is no abdominal tenderness. There is left CVA tenderness. There is no right CVA tenderness.  Musculoskeletal:     Cervical back: Neck supple.  Skin:    General: Skin is warm and dry.     Capillary Refill: Capillary refill takes less than 2 seconds.  Neurological:     General: No focal deficit present.     Mental Status: He is alert. Mental status is at baseline.     Motor: No weakness.     Coordination: Coordination normal.     Gait: Gait normal.  Psychiatric:        Mood and Affect: Mood normal.        Behavior: Behavior normal.        Thought Content: Thought content normal.     UC Treatments / Results  Labs (all labs ordered are listed, but only abnormal results are displayed) Labs Reviewed  URINALYSIS, COMPLETE (UACMP) WITH MICROSCOPIC - Abnormal; Notable for the following components:      Result Value   APPearance CLOUDY (*)    Hgb urine dipstick LARGE (*)    Bilirubin Urine SMALL (*)    Ketones, ur 15 (*)    Protein, ur 100 (*)    Leukocytes,Ua SMALL (*)    Bacteria, UA FEW (*)    All other components within normal limits  URINE CULTURE    EKG   Radiology DG Abdomen 1 View  Result Date: 12/28/2021 CLINICAL DATA:  Acute left flank pain, gross hematuria. EXAM: ABDOMEN - 1 VIEW COMPARISON:  None. FINDINGS: The  bowel gas pattern is normal. No radio-opaque calculi or other significant radiographic abnormality are seen. IMPRESSION: Negative. Electronically Signed   By: Marijo Conception M.D.   On: 12/28/2021 17:25    Procedures Procedures (including critical care time)  Medications Ordered in UC Medications - No data to display  Initial Impression / Assessment and Plan / UC Course  I have reviewed the triage vital signs and the nursing notes.  Pertinent labs & imaging results that were available during my care of the patient were reviewed by me and considered in my medical decision making (see chart for details).   46 year old male presenting for 3-day history of fever, fatigue, cough and congestion.  Patient also reporting 3-day history of dysuria, hematuria and difficulty urinating as well as new onset left flank pain over the past 24 hours.  Vitals are normal and stable.  Patient is ill-appearing but nontoxic.  He does appear to be in pain.  Patient gesturing to the left flank pain.  On exam he has mild nasal congestion.  Chest clear auscultation heart regular rate and rhythm.  Left CVA tenderness.  Urinalysis shows large hemoglobin, small bili, ketone, protein, small leukocytes and bacteria.  We will send urine for culture.  With patient about COVID testing but he says he tested at home and is negative.  He will take another COVID test at home.  Reviewed current CDC guidelines, isolation protocol and ED precautions if COVID-positive.  A KUB today was normal.  Advised patient based on  his urinary symptoms and exam as well as urinalysis I suspect he has acute pyelonephritis.  We will treat at this time with ciprofloxacin.  Also sent short supply of Norco for his pain.  Advised him pain should be better in the next couple days but if its not already has continued symptoms or worsening pain, fever or continued blood in the urine he needs to be seen again and have CT scan to definitively rule out kidney  stone.  Advised him to increase his fluid intake.  Patient agrees to plan.    Final Clinical Impressions(s) / UC Diagnoses   Final diagnoses:  Acute pyelonephritis  Dysuria  Left flank pain  Viral upper respiratory illness     Discharge Instructions      -Your urinalysis shows that you have a urinary tract infection.  Since you have flank pain and you likely have a kidney infection.  I have sent antibiotics to pharmacy.  Make sure to take them all.  Sure to increase your fluid intake. - We did a x-ray to see if you had any kidney stone.  As we discussed, there is no evidence of kidney stone on your x-ray but x-rays do not think of ever kidney stone so if the flank pain is not getting better you may need to be seen again and have a CT scan. - If the pain is not getting better, you have continued fever or more blood in the urine after the next couple of days you need to go to emergency department for CT scan. - Additionally, you should take another COVID test when he got home given your cough and congestion.  It is possible you could have COVID.  I know the first test was negative but usually is her second.  If you have COVID you need to isolate 5 days when your symptoms began and then wear a mask for 5 days.  Keep taking Mucinex.  You need to be seen again if you have any chest pain or breathing trouble.  More likely a viral illness.     ED Prescriptions     Medication Sig Dispense Auth. Provider   ciprofloxacin (CIPRO) 500 MG tablet Take 1 tablet (500 mg total) by mouth 2 (two) times daily for 14 days. 28 tablet Laurene Footman B, PA-C   HYDROcodone-acetaminophen (NORCO/VICODIN) 5-325 MG tablet Take 1 tablet by mouth every 6 (six) hours as needed for up to 2 days. 6 tablet Danton Clap, PA-C      I have reviewed the PDMP during this encounter.   Danton Clap, PA-C 12/28/21 1751

## 2021-12-28 NOTE — Telephone Encounter (Signed)
°  Chief Complaint: blood in urine and burning with urination, urine smells bad,  Symptoms: abd pain on left side mostly Frequency: started Friday  Feels like he is not empying his bladder all the way Pertinent Negatives: Patient denies N/A Disposition: [] ED /Urgent Care (no appt availability in office) / [] Appointment(In office/virtual)/ []  [place] [facility]/ [] Home Care/ [] Refused Recommended Disposition /[] [place] [facility]/ []  Follow-up with PCP Additional Notes: There are no appts today so pt agreeable to going to the urgent care at [facility].

## 2021-12-28 NOTE — Telephone Encounter (Signed)
Reason for Disposition  Blood in urine  (Exception: could be normal menstrual bleeding)  Answer Assessment - Initial Assessment Questions 1. COLOR of URINE: "Describe the color of the urine."  (e.g., tea-colored, pink, red, blood clots, bloody)     I returned his call.   Friday started getting sick.   Sat. Could not hardly use the bathroom.   It was a dark yellow and it burned to pee.    2. ONSET: "When did the bleeding start?"      Yesterday started see blood.    My stomach is bothering me today.   My whole stomach is hurting.  I had diarrhea.   Sat. I ate tomato soup and a pop tart.  No appetite.   I'm drinking pedialyte and water.   My pee is clear but still burns.  Having blood in urine today too. 3. EPISODES: "How many times has there been blood in the urine?" or "How many times today?"     *No Answer* 4. PAIN with URINATION: "Is there any pain with passing your urine?" If Yes, ask: "How bad is the pain?"  (Scale 1-10; or mild, moderate, severe)    - MILD - complains slightly about urination hurting    - MODERATE - interferes with normal activities      - SEVERE - excruciating, unwilling or unable to urinate because of the pain      Yes burning Bottom of my back hurt over weekend but not now.    My stomach is upset.  Covid negative.   5. FEVER: "Do you have a fever?" If Yes, ask: "What is your temperature, how was it measured, and when did it start?"     I've had a fever  I was out of town when I started getting sick.    My granddaughter had a cold. 6. ASSOCIATED SYMPTOMS: "Are you passing urine more frequently than usual?"     Chest congestion.   I'm taking medicine for the congestion.   7. OTHER SYMPTOMS: "Do you have any other symptoms?" (e.g., back/flank pain, abdominal pain, vomiting)     *No Answer* 8. PREGNANCY: "Is there any chance you are pregnant?" "When was your last menstrual period?"     *No Answer*  Protocols used: Urine - Blood In-A-AH

## 2021-12-28 NOTE — ED Triage Notes (Signed)
Sx started on Friday. Pain when using bathroom. Left side pain started today.

## 2021-12-29 ENCOUNTER — Ambulatory Visit: Payer: Self-pay

## 2021-12-31 LAB — URINE CULTURE: Culture: 80000 — AB

## 2022-01-04 ENCOUNTER — Other Ambulatory Visit: Payer: Self-pay | Admitting: Unknown Physician Specialty

## 2022-01-04 DIAGNOSIS — M5416 Radiculopathy, lumbar region: Secondary | ICD-10-CM

## 2022-01-04 NOTE — Telephone Encounter (Signed)
Requested Prescriptions  Pending Prescriptions Disp Refills   gabapentin (NEURONTIN) 300 MG capsule [Pharmacy Med Name: GABAPENTIN 300 MG CAPSULE] 180 capsule 0    Sig: TAKE 2 CAPSULES BY MOUTH 3 TIMES A DAY     Neurology: Anticonvulsants - gabapentin Passed - 01/04/2022  1:05 PM      Passed - Valid encounter within last 12 months    Recent Outpatient Visits          5 months ago Medication monitoring encounter   [facility] Kathrine Haddock, NP   9 months ago Lumbar radiculopathy   [facility] Trinna Post, [place]   1 year ago Annual physical exam   [facility] Lebron Conners D, MD   2 years ago Left hip pain   [place], FNP   2 years ago Obstructive sleep apnea on CPAP   [place], MD      Future Appointments            In 1 month Delsa Grana, PA-C [facility], [facility]

## 2022-02-03 ENCOUNTER — Encounter: Payer: BC Managed Care – PPO | Admitting: Family Medicine

## 2022-02-11 ENCOUNTER — Encounter: Payer: BC Managed Care – PPO | Admitting: Nurse Practitioner

## 2022-03-02 ENCOUNTER — Other Ambulatory Visit: Payer: Self-pay | Admitting: Unknown Physician Specialty

## 2022-03-02 NOTE — Telephone Encounter (Signed)
Requested medication (s) are due for refill today:   Yes ? ?Requested medication (s) are on the active medication list:   Yes ? ?Future visit scheduled:   Yes in 2 days ? ? ?Last ordered: 07/29/2021 #180, 1 refill ? ?Returned because protocol criteria not met.   Lab work due.    ? ?Requested Prescriptions  ?Pending Prescriptions Disp Refills  ? naproxen (NAPROSYN) 500 MG tablet [Pharmacy Med Name: NAPROXEN 500 MG TABLET] 180 tablet 1  ?  Sig: TAKE 1 TABLET BY MOUTH 2 TIMES DAILY WITH A MEAL.  ?  ? Analgesics:  NSAIDS Failed - 03/02/2022  8:46 AM  ?  ?  Failed - Manual Review: Labs are only required if the patient has taken medication for more than 8 weeks.  ?  ?  Failed - HGB in normal range and within 360 days  ?  Hemoglobin  ?Date Value Ref Range Status  ?11/12/2020 16.6 13.2 - 17.1 g/dL Final  ?  ?  ?  ?  Failed - PLT in normal range and within 360 days  ?  Platelets  ?Date Value Ref Range Status  ?11/12/2020 260 140 - 400 Thousand/uL Final  ?  ?  ?  ?  Failed - HCT in normal range and within 360 days  ?  HCT  ?Date Value Ref Range Status  ?11/12/2020 48.6 38.5 - 50.0 % Final  ?  ?  ?  ?  Passed - Cr in normal range and within 360 days  ?  Creat  ?Date Value Ref Range Status  ?07/29/2021 1.06 0.60 - 1.29 mg/dL Final  ?  ?  ?  ?  Passed - eGFR is 30 or above and within 360 days  ?  GFR, Est African American  ?Date Value Ref Range Status  ?11/12/2020 115 > OR = 60 mL/min/1.24m Final  ? ?GFR, Est Non African American  ?Date Value Ref Range Status  ?11/12/2020 99 > OR = 60 mL/min/1.727mFinal  ? ?eGFR  ?Date Value Ref Range Status  ?07/29/2021 87 > OR = 60 mL/min/1.7319minal  ?  Comment:  ?  The eGFR is based on the CKD-EPI 2021 equation. To calculate  ?the new eGFR from a previous Creatinine or Cystatin C ?result, go to https://www.kidney.org/professionals/ ?kdoqi/gfr%5Fcalculator ?  ?  ?  ?  ?  Passed - Patient is not pregnant  ?  ?  Passed - Valid encounter within last 12 months  ?  Recent Outpatient Visits   ? ?       ? 7 months [facility]ication [facility]  [facility]  ? 11 months [facility]  [facility] 1 year ago [facility] physical exam  ? [facility]  ? 2 years ago Left hip pain  ? [facility]  ? 3 years ago Obstructive sleep apnea on CPAP  ? [facility] [place] AnisD  ? ?  ?  ?Future Appointments   ? ?        ? In 2 days [facility] ?  ? ?  ?  ?  ? ?

## 2022-03-03 ENCOUNTER — Other Ambulatory Visit: Payer: Self-pay

## 2022-03-03 MED ORDER — NAPROXEN 500 MG PO TABS: 500.0000 mg | ORAL_TABLET | Freq: Two times a day (BID) | ORAL | 1 refills | Status: DC

## 2022-03-04 ENCOUNTER — Other Ambulatory Visit: Payer: Self-pay

## 2022-03-04 ENCOUNTER — Encounter: Payer: Self-pay | Admitting: Nurse Practitioner

## 2022-03-04 ENCOUNTER — Ambulatory Visit (INDEPENDENT_AMBULATORY_CARE_PROVIDER_SITE_OTHER): Payer: BC Managed Care – PPO | Admitting: Nurse Practitioner

## 2022-03-04 VITALS — BP 118/74 | HR 98 | Temp 98.5°F | Resp 18 | Ht 70.0 in | Wt 290.6 lb

## 2022-03-04 DIAGNOSIS — E782 Mixed hyperlipidemia: Secondary | ICD-10-CM

## 2022-03-04 DIAGNOSIS — Z130 Encounter for screening for diseases of the blood and blood-forming organs and certain disorders involving the immune mechanism: Secondary | ICD-10-CM | POA: Diagnosis not present

## 2022-03-04 DIAGNOSIS — Z0000 Encounter for general adult medical examination without abnormal findings: Secondary | ICD-10-CM | POA: Diagnosis not present

## 2022-03-04 DIAGNOSIS — Z131 Encounter for screening for diabetes mellitus: Secondary | ICD-10-CM | POA: Diagnosis not present

## 2022-03-04 NOTE — Progress Notes (Signed)
Name: Nathan [name]   MRN: [id number]    DOB: 11/21/1976   Date:03/04/2022       Progress Note  Subjective  Chief Complaint  Chief Complaint  Patient presents with   Annual Exam    HPI  Patient presents for annual CPE.   IPSS Questionnaire (AUA-7): Over the past month   1)  How often have you had a sensation of not emptying your bladder completely after you finish urinating?  0 - Not at all  2)  How often have you had to urinate again less than two hours after you finished urinating? 0 - Not at all  3)  How often have you found you stopped and started again several times when you urinated?  0 - Not at all  4) How difficult have you found it to postpone urination?  0 - Not at all  5) How often have you had a weak urinary stream?  0 - Not at all  6) How often have you had to push or strain to begin urination?  0 - Not at all  7) How many times did you most typically get up to urinate from the time you went to bed until the time you got up in the morning?  2 - 2 times  Total score:  0-7 mildly symptomatic   8-19 moderately symptomatic   20-35 severely symptomatic    Diet: well balanced, eats anything, he tries to eat smaller portions Exercise: physical job, works 10 hours a day for four days a week Sleep:sleep apnea, he has a hard time using his cpap machine, sleeps about 6 hours during the week on weekends maybe sleeps 7 hours  Depression: phq 9 is negative Depression screen [facility] 2/9 03/04/2022 07/29/2021 03/19/2021 10/28/2020 12/26/2019  Decreased Interest 0 0 0 0 0  Down, Depressed, Hopeless 0 0 0 0 0  PHQ - 2 Score 0 0 0 0 0  Altered sleeping 0 - 0 - 0  Tired, decreased energy 0 - 0 - 0  Change in appetite 0 - 0 - 0  Feeling bad or failure about yourself  0 - 0 - 0  Trouble concentrating 0 - 0 - 0  Moving slowly or fidgety/restless 0 - 0 - 0  Suicidal thoughts 0 - 0 - 0  PHQ-9 Score 0 - 0 - 0  Difficult doing work/chores Not difficult at all - Not difficult at all - Not  difficult at all    Hypertension:  BP Readings from Last 3 Encounters:  03/04/22 118/74  12/28/21 134/90  11/17/21 (!) 155/91   Obesity: Wt Readings from Last 3 Encounters:  03/04/22 290 lb 9.6 oz (131.8 kg)  12/28/21 286 lb (129.7 kg)  11/17/21 291 lb 0.1 oz (132 kg)   BMI Readings from Last 3 Encounters:  03/04/22 41.70 kg/m  12/28/21 41.04 kg/m  11/17/21 41.17 kg/m     Lipids:  Lab Results  Component Value Date   CHOL 177 11/12/2020   CHOL 199 01/11/2019   CHOL 195 03/16/2018   Lab Results  Component Value Date   HDL 29 (L) 11/12/2020   HDL 39 (L) 01/11/2019   HDL 29 (L) 03/16/2018   Lab Results  Component Value Date   LDLCALC 115 (H) 11/12/2020   LDLCALC 126 (H) 01/11/2019   LDLCALC 122 (H) 03/16/2018   Lab Results  Component Value Date   TRIG 210 (H) 11/12/2020   TRIG 202 (H) 01/11/2019   TRIG 286 (H)  03/16/2018   Lab Results  Component Value Date   CHOLHDL 6.1 (H) 11/12/2020   CHOLHDL 5.1 (H) 01/11/2019   CHOLHDL 6.7 (H) 03/16/2018   No results found for: LDLDIRECT Glucose:  Glucose, Bld  Date Value Ref Range Status  07/29/2021 75 65 - 99 mg/dL Final    Comment:    .            Fasting reference interval .   11/12/2020 101 (H) 65 - 99 mg/dL Final    Comment:    .            Fasting reference interval . For someone without known diabetes, a glucose value between 100 and 125 mg/dL is consistent with prediabetes and should be confirmed with a follow-up test. .   01/11/2019 86 65 - 99 mg/dL Final    Comment:    .            Fasting reference interval .     [place] Office Visit from 03/04/2022 in [facility]  AUDIT-C Score 1       Married STD testing and prevention (HIV/chl/gon/syphilis): declines Hep C: 11/12/2020  Skin cancer: Discussed monitoring for atypical lesions Colorectal cancer: 11/16/2020 Prostate cancer: no concerns No results found for: PSA   Lung cancer:   Low Dose CT Chest  recommended if Age 30-80 years, 30 pack-year currently smoking OR have quit w/in 15years. Patient does not qualify.   AAA:  The USPSTF recommends one-time screening with ultrasonography in men ages 50 to 44 years who have ever smoked ECG:  11/12/2015  Vaccines:  HPV: up to at age 76 , ask insurance if age between 51-45  Shingrix: 67-64 yo and ask insurance if covered when patient above 93 yo Pneumonia:  educated and discussed with patient. Flu:  educated and discussed with patient.  Advanced Care Planning: A voluntary discussion about advance care planning including the explanation and discussion of advance directives.  Discussed health care proxy and Living will, and the patient was able to identify a health care proxy as wife, Nathan [name].  Patient does not have a living will at present time. If patient does have living will, I have requested they bring this to the clinic to be scanned in to their chart.  Patient Active Problem List   Diagnosis Date Noted   Bulging lumbar disc 07/29/2021   Personal history of colonic polyps    Elevated BP without diagnosis of hypertension 10/28/2020   Vitamin D deficiency 01/15/2019   Bone spur of foot 03/16/2018   Hemorrhoid 03/16/2018   Family history of colon cancer    GERD (gastroesophageal reflux disease) 09/05/2017   Bilateral elbow joint pain 11/23/2016   Obstructive sleep apnea on CPAP 11/12/2015   Morbid obesity ([place]) 11/12/2015   Syncope and collapse 09/24/2015   Snoring 09/24/2015   Abnormal EKG 09/24/2015    Past Surgical History:  Procedure Laterality Date   COLONOSCOPY WITH PROPOFOL N/A 10/20/2017   Procedure: COLONOSCOPY WITH PROPOFOL;  Surgeon: Lucilla Lame, MD;  Location: [place];  Service: Endoscopy;  Laterality: N/A;   COLONOSCOPY WITH PROPOFOL N/A 11/16/2020   Procedure: COLONOSCOPY WITH BIOPSY;  Surgeon: Lucilla Lame, MD;  Location: [place];  Service: Endoscopy;  Laterality: N/A;  Sleep  apnea priority 4   ESOPHAGOGASTRODUODENOSCOPY (EGD) WITH PROPOFOL N/A 10/20/2017   Procedure: ESOPHAGOGASTRODUODENOSCOPY (EGD) WITH PROPOFOL;  Surgeon: Lucilla Lame, MD;  Location: [place];  Service: Endoscopy;  Laterality: N/A;  sleep apnea   GALLBLADDER SURGERY     POLYPECTOMY N/A 11/16/2020   Procedure: POLYPECTOMY;  Surgeon: Lucilla Lame, MD;  Location: [place];  Service: Endoscopy;  Laterality: N/A;    Family History  Problem Relation Age of Onset   Cancer Mother    Stroke Father    Heart disease Father    Diabetes Father     Social History   Socioeconomic History   Marital status: Married    Spouse name: Not on file   Number of children: Not on file   Years of education: Not on file   Highest education level: Not on file  Occupational History   Not on file  Tobacco Use   Smoking status: Former    Packs/day: 1.00    Years: 16.00    Pack years: 16.00    Types: Cigarettes    Quit date: 2008    Years since quitting: 15.1   Smokeless tobacco: Current    Types: Snuff, Chew  Vaping Use   Vaping Use: Never used  Substance and Sexual Activity   Alcohol use: Yes    Comment: rare   Drug use: No   Sexual activity: Not on file  Other Topics Concern   Not on file  Social History Narrative   Not on file   Social Determinants of Health   Financial Resource Strain: Low Risk    Difficulty of Paying Living Expenses: Not hard at all  Food Insecurity: No Food Insecurity   Worried About Charity fundraiser in the Last Year: Never true   [place] in the Last Year: Never true  Transportation Needs: No Transportation Needs   Lack of Transportation (Medical): No   Lack of Transportation (Non-Medical): No  Physical Activity: Sufficiently Active   Days of Exercise per Week: 7 days   Minutes of Exercise per Session: 80 min  Stress: Stress Concern Present   Feeling of Stress : To some extent  Social Connections: Moderately Integrated   Frequency  of Communication with Friends and Family: More than three times a week   Frequency of Social Gatherings with Friends and Family: More than three times a week   Attends Religious Services: Never   Marine scientist or Organizations: Yes   Attends Music therapist: More than 4 times per year   Marital Status: Married  Human resources officer Violence: Not At Risk   Fear of Current or Ex-Partner: No   Emotionally Abused: No   Physically Abused: No   Sexually Abused: No     Current Outpatient Medications:    gabapentin (NEURONTIN) 300 MG capsule, TAKE 2 CAPSULES BY MOUTH 3 TIMES A DAY, Disp: 180 capsule, Rfl: 0   ipratropium (ATROVENT) 0.06 % nasal spray, Place 2 sprays into both nostrils 4 (four) times daily., Disp: 15 mL, Rfl: 12   omeprazole (PRILOSEC) 20 MG capsule, Take 20 mg by mouth daily., Disp: , Rfl:   No Known Allergies   ROS  Constitutional: Negative for fever or weight change.  Respiratory: Negative for cough and shortness of breath.   Cardiovascular: Negative for chest pain or palpitations.  Gastrointestinal: Negative for abdominal pain, no bowel changes.  Musculoskeletal: Negative for gait problem or joint swelling.  Skin: Negative for rash.  Neurological: Negative for dizziness or headache.  No other specific complaints in a complete review of systems (except as listed in HPI above).    Objective  Vitals:   03/04/22 7824  BP: 118/74  Pulse: 98  Resp: 18  Temp: 98.5 F (36.9 C)  TempSrc: Oral  SpO2: 96%  Weight: 290 lb 9.6 oz (131.8 kg)  Height: '5\' 10"'$  (1.778 m)    Body mass index is 41.7 kg/m.  Physical Exam  Constitutional: Patient appears well-developed and well-nourished. No distress.  HENT: Head: Normocephalic and atraumatic. Ears: B TMs ok, no erythema or effusion; Nose: Nose normal. Mouth/Throat: Oropharynx is clear and moist. No oropharyngeal exudate.  Eyes: Conjunctivae and EOM are normal. Pupils are equal, round, and reactive to  light. No scleral icterus.  Neck: Normal range of motion. Neck supple. No JVD present. No thyromegaly present.  Cardiovascular: Normal rate, regular rhythm and normal heart sounds.  No murmur heard. No BLE edema. Pulmonary/Chest: Effort normal and breath sounds normal. No respiratory distress. Abdominal: Soft. Bowel sounds are normal, no distension. There is no tenderness. no masses Musculoskeletal: Normal range of motion, no joint effusions. No gross deformities Neurological: he is alert and oriented to person, place, and time. No cranial nerve deficit. Coordination, balance, strength, speech and gait are normal.  Skin: Skin is warm and dry. No rash noted. No erythema.  Psychiatric: Patient has a normal mood and affect. behavior is normal. Judgment and thought content normal.   Recent Results (from the past 2160 hour(s))  Urinalysis, Complete w Microscopic Urine, Clean Catch     Status: Abnormal   Collection Time: 12/28/21  3:03 PM  Result Value Ref Range   Color, Urine YELLOW YELLOW   APPearance CLOUDY (A) CLEAR   Specific Gravity, Urine 1.020 1.005 - 1.030   pH 6.0 5.0 - 8.0   Glucose, UA NEGATIVE NEGATIVE mg/dL   Hgb urine dipstick LARGE (A) NEGATIVE   Bilirubin Urine SMALL (A) NEGATIVE   Ketones, ur 15 (A) NEGATIVE mg/dL   Protein, ur 100 (A) NEGATIVE mg/dL   Nitrite NEGATIVE NEGATIVE   Leukocytes,Ua SMALL (A) NEGATIVE   Squamous Epithelial / LPF 0-5 0 - 5   WBC, UA >50 0 - 5 WBC/hpf   RBC / HPF >50 0 - 5 RBC/hpf   Bacteria, UA FEW (A) NONE SEEN   Mucus PRESENT     Comment: Performed at [facility]gent [facility], [address], [place], [place] [address]  Urine Culture     Status: Abnormal   Collection Time: 12/28/21  3:03 PM   Specimen: Urine, Clean Catch  Result Value Ref Range   Specimen Description      URINE, CLEAN CATCH Performed at [facility], [address], [place], [place] [address]    Special Requests      NONE Performed at [facility], [address] [place], [place] [address]    Culture 80,000 COLONIES/mL ESCHERICHIA COLI (A)    Report Status 12/31/2021 FINAL    Organism ID, Bacteria ESCHERICHIA COLI (A)       Susceptibility   Escherichia coli - MIC*    AMPICILLIN >=32 RESISTANT Resistant     CEFAZOLIN <=4 SENSITIVE Sensitive     CEFEPIME <=0.12 SENSITIVE Sensitive     CEFTRIAXONE <=0.25 SENSITIVE Sensitive     CIPROFLOXACIN <=0.25 SENSITIVE Sensitive     GENTAMICIN <=1 SENSITIVE Sensitive     IMIPENEM <=0.25 SENSITIVE Sensitive     NITROFURANTOIN <=16 SENSITIVE Sensitive     TRIMETH/SULFA <=20 SENSITIVE Sensitive     AMPICILLIN/SULBACTAM 16 INTERMEDIATE Intermediate     PIP/TAZO <=4 SENSITIVE Sensitive     * 80,000 COLONIES/mL  ESCHERICHIA COLI     Fall Risk: Fall Risk  03/04/2022 07/29/2021 03/19/2021 10/28/2020 12/26/2019  Falls in the past year? 0 0 0 0 0  Number falls in past yr: 0 0 0 0 0  Injury with Fall? 0 0 0 0 0  Follow up Falls evaluation completed Falls evaluation completed - - Falls evaluation completed    Functional Status Survey: Is the patient deaf or have difficulty hearing?: No Does the patient have difficulty seeing, even when wearing glasses/contacts?: No Does the patient have difficulty concentrating, remembering, or making decisions?: No Does the patient have difficulty walking or climbing stairs?: No Does the patient have difficulty dressing or bathing?: No Does the patient have difficulty doing errands alone such as visiting a doctor's office or shopping?: No    Assessment & Plan  1. Annual physical exam  - Lipid panel - COMPLETE METABOLIC PANEL WITH GFR - CBC with Differential/Platelet - Hemoglobin A1c  2. Morbid obesity ([place])  - COMPLETE METABOLIC PANEL WITH GFR - CBC with Differential/Platelet - Hemoglobin A1c  3. Mixed hyperlipidemia  - Lipid panel  4. Screening for diabetes mellitus  - COMPLETE METABOLIC PANEL WITH GFR - Hemoglobin A1c  5. Screening  for deficiency anemia  - CBC with Differential/Platelet    -Prostate cancer screening and PSA options (with potential risks and benefits of testing vs not testing) were discussed along with recent recs/guidelines. -USPSTF grade A and B recommendations reviewed with patient; age-appropriate recommendations, preventive care, screening tests, etc discussed and encouraged; healthy living encouraged; see AVS for patient education given to patient -Discussed importance of 150 minutes of physical activity weekly, eat two servings of fish weekly, eat one serving of tree nuts ( cashews, pistachios, pecans, almonds.Marland Kitchen) every other day, eat 6 servings of fruit/vegetables daily and drink plenty of water and avoid sweet beverages.

## 2022-03-05 LAB — CBC WITH DIFFERENTIAL/PLATELET
Absolute Monocytes: 581 cells/uL (ref 200–950)
Basophils Absolute: 70 cells/uL (ref 0–200)
Basophils Relative: 0.8 %
Eosinophils Absolute: 238 cells/uL (ref 15–500)
Eosinophils Relative: 2.7 %
HCT: 50.6 % — ABNORMAL HIGH (ref 38.5–50.0)
Hemoglobin: 17.1 g/dL (ref 13.2–17.1)
Lymphs Abs: 1487 cells/uL (ref 850–3900)
MCH: 28.5 pg (ref 27.0–33.0)
MCHC: 33.8 g/dL (ref 32.0–36.0)
MCV: 84.3 fL (ref 80.0–100.0)
MPV: 10.3 fL (ref 7.5–12.5)
Monocytes Relative: 6.6 %
Neutro Abs: 6424 cells/uL (ref 1500–7800)
Neutrophils Relative %: 73 %
Platelets: 320 10*3/uL (ref 140–400)
RBC: 6 10*6/uL — ABNORMAL HIGH (ref 4.20–5.80)
RDW: 13.2 % (ref 11.0–15.0)
Total Lymphocyte: 16.9 %
WBC: 8.8 10*3/uL (ref 3.8–10.8)

## 2022-03-05 LAB — HEMOGLOBIN A1C
Hgb A1c MFr Bld: 5.3 % of total Hgb (ref <5.7)
Mean Plasma Glucose: 105 mg/dL
eAG (mmol/L): 5.8 mmol/L

## 2022-03-05 LAB — COMPLETE METABOLIC PANEL WITH GFR
AG Ratio: 2 (calc) (ref 1.0–2.5)
ALT: 24 U/L (ref 9–46)
AST: 20 U/L (ref 10–40)
Albumin: 4.4 g/dL (ref 3.6–5.1)
Alkaline phosphatase (APISO): 66 U/L (ref 36–130)
BUN: 13 mg/dL (ref 7–25)
CO2: 26 mmol/L (ref 20–32)
Calcium: 9 mg/dL (ref 8.6–10.3)
Chloride: 106 mmol/L (ref 98–110)
Creat: 0.89 mg/dL (ref 0.60–1.29)
Globulin: 2.2 g/dL (calc) (ref 1.9–3.7)
Glucose, Bld: 103 mg/dL — ABNORMAL HIGH (ref 65–99)
Potassium: 4.5 mmol/L (ref 3.5–5.3)
Sodium: 140 mmol/L (ref 135–146)
Total Bilirubin: 0.6 mg/dL (ref 0.2–1.2)
Total Protein: 6.6 g/dL (ref 6.1–8.1)
eGFR: 106 mL/min/{1.73_m2} (ref >=60)

## 2022-03-05 LAB — LIPID PANEL
Cholesterol: 191 mg/dL (ref <200)
HDL: 32 mg/dL — ABNORMAL LOW (ref >=40)
LDL Cholesterol (Calc): 130 mg/dL (calc) — ABNORMAL HIGH
Non-HDL Cholesterol (Calc): 159 mg/dL (calc) — ABNORMAL HIGH (ref <130)
Total CHOL/HDL Ratio: 6 (calc) — ABNORMAL HIGH (ref <5.0)
Triglycerides: 172 mg/dL — ABNORMAL HIGH (ref <150)

## 2022-05-03 ENCOUNTER — Other Ambulatory Visit: Payer: Self-pay | Admitting: Unknown Physician Specialty

## 2022-05-03 DIAGNOSIS — M5416 Radiculopathy, lumbar region: Secondary | ICD-10-CM

## 2022-05-04 NOTE — Telephone Encounter (Signed)
Requested Prescriptions  ?Pending Prescriptions Disp Refills  ?? gabapentin (NEURONTIN) 300 MG capsule [Pharmacy Med Name: GABAPENTIN 300 MG CAPSULE] 180 capsule 0  ?  Sig: TAKE 2 CAPSULES BY MOUTH 3 TIMES A DAY  ?  ? Neurology: Anticonvulsants - gabapentin Passed - 05/03/2022  1:16 PM  ?  ?  Passed - Cr in normal range and within 360 days  ?  Creat  ?Date Value Ref Range Status  ?03/04/2022 0.89 0.60 - 1.29 mg/dL Final  ?   ?  ?  Passed - Completed PHQ-2 or PHQ-9 in the last 360 days  ?  ?  Passed - Valid encounter within last 12 months  ?  Recent Outpatient Visits   ?      ? 2 months ago Annual physical exam  ? [facility] Bo Merino, FNP  ? 9 months ago Medication monitoring encounter  ? [facility] Kathrine Haddock, NP  ? 1 year ago Lumbar radiculopathy  ? [facility] Trinna Post, [place]  ? 1 year ago Annual physical exam  ? [facility] Lebron Conners D, MD  ? 2 years ago Left hip pain  ? [place], [place]  ?  ?  ?Future Appointments   ?        ? In 1 month Reece Packer, Myna Hidalgo, [facility], [place]  ?  ? ?  ?  ?  ? ?

## 2022-05-17 ENCOUNTER — Telehealth: Payer: Self-pay

## 2022-05-17 ENCOUNTER — Other Ambulatory Visit: Payer: Self-pay | Admitting: Nurse Practitioner

## 2022-05-17 MED ORDER — SEMAGLUTIDE-WEIGHT MANAGEMENT 0.25 MG/0.5ML ~~LOC~~ SOAJ: 0.2500 mg | SUBCUTANEOUS | 0 refills | Status: DC

## 2022-05-17 NOTE — Telephone Encounter (Unsigned)
Copied from [place] [phone]. Topic: General - Other >> May 17, 2022  8:58 AM Bayard Beaver wrote: Reason for CRM:pt called in says was told by Dr Reece Packer was [place] work with him about a shot for weight loss before his app on June 9.

## 2022-05-17 NOTE — Telephone Encounter (Signed)
Spoke to patient and would like script called into pharmacy

## 2022-05-17 NOTE — Telephone Encounter (Signed)
Patient notified

## 2022-05-24 ENCOUNTER — Telehealth: Payer: Self-pay

## 2022-05-24 NOTE — Telephone Encounter (Unsigned)
Copied from [place] [id number]. Topic: General - Other >> May 24, 2022  9:37 AM Tessa Lerner A wrote: Reason for CRM: The patient would like to speak with Bonnita Nasuti when possible  The patient has concerns related to their prescriptions and has requested to speak with Bonnita Nasuti directly regarding the matter  Please contact when available

## 2022-05-24 NOTE — Telephone Encounter (Signed)
Need PA done for Ozempic   [facility]

## 2022-05-25 NOTE — Telephone Encounter (Signed)
PA sen, waiting on reply

## 2022-05-26 NOTE — Telephone Encounter (Signed)
Pt called in stating when he went to the medication web site, it gave him a 12 free refill coupon, but he states he doesn't know how to get it, and needs a call back please advise.

## 2022-05-27 NOTE — Telephone Encounter (Signed)
Called x 2 no answer

## 2022-05-31 NOTE — Telephone Encounter (Signed)
Pt called back and requested to speak directly with [facility].

## 2022-06-03 ENCOUNTER — Other Ambulatory Visit: Payer: Self-pay | Admitting: Nurse Practitioner

## 2022-06-03 ENCOUNTER — Ambulatory Visit: Payer: BC Managed Care – PPO | Admitting: Nurse Practitioner

## 2022-06-03 ENCOUNTER — Telehealth: Payer: Self-pay | Admitting: Nurse Practitioner

## 2022-06-03 ENCOUNTER — Encounter: Payer: Self-pay | Admitting: Nurse Practitioner

## 2022-06-03 DIAGNOSIS — E782 Mixed hyperlipidemia: Secondary | ICD-10-CM | POA: Diagnosis not present

## 2022-06-03 DIAGNOSIS — K219 Gastro-esophageal reflux disease without esophagitis: Secondary | ICD-10-CM

## 2022-06-03 DIAGNOSIS — G4733 Obstructive sleep apnea (adult) (pediatric): Secondary | ICD-10-CM | POA: Diagnosis not present

## 2022-06-03 DIAGNOSIS — M5416 Radiculopathy, lumbar region: Secondary | ICD-10-CM | POA: Insufficient documentation

## 2022-06-03 DIAGNOSIS — Z9989 Dependence on other enabling machines and devices: Secondary | ICD-10-CM

## 2022-06-03 MED ORDER — NOVOFINE PEN NEEDLE 32G X 6 MM MISC: 1.0000 | Freq: Every day | 0 refills | Status: DC

## 2022-06-03 MED ORDER — SEMAGLUTIDE-WEIGHT MANAGEMENT 0.25 MG/0.5ML ~~LOC~~ SOAJ: 0.2500 mg | SUBCUTANEOUS | 0 refills | Status: DC

## 2022-06-03 MED ORDER — SAXENDA 18 MG/3ML ~~LOC~~ SOPN: 0.6000 mg | PEN_INJECTOR | Freq: Every day | SUBCUTANEOUS | 0 refills | Status: DC

## 2022-06-03 NOTE — Progress Notes (Addendum)
BP 130/80   Pulse 96   Temp 98 F (36.7 C) (Oral)   Resp 16   Ht _0  (1.778 m)   Wt 290 lb (131.5 kg)   SpO2 97%   BMI 41.61 kg/m    Subjective:    [name], male    DOB: June 01, 1976, 47 y.o.   MRN: [id number]  HPI: Nathan [name] is a 46 y.o. male  Chief Complaint  Patient presents with   Follow-up   Morbid obesity: Patients current weight is 290 lbs with a BMI of 41.61.  Pt states he wanted to try wegovy.  His insurance would not cover it, however he did find a coupon that helped him afford it however now it is on back order.  Patient reports he would like to have the Nathan [name] prescription sent to another pharmacy.  We will send in that prescription to another pharmacy.  Discussed other option of trying Saxenda.  Patient would like to stick with Wegovy at this time.  Hyperlipidemia: His last LDL was 130 on 03/04/2022.  Pt is not currently on medication.  Discussed decreasing saturated fat in his diet.    The 10-year ASCVD risk score (Arnett DK, et al., 2019) is: 4.5%   Values used to calculate the score:     Age: 73 years     Sex: Male     Is Non-Hispanic African American: No     Diabetic: No     Tobacco smoker: No     Systolic Blood Pressure: 741 mmHg     Is BP treated: No     HDL Cholesterol: 32 mg/dL     Total Cholesterol: 191 mg/dL   GERD: OTC acid reducer, symptoms well cotrolled.  Patient used to be on omeprazole.  He says his insurance does not cover it.  Nathan [name] he just buys over-the-counter.  OSA on CPAP: He says he does not use his cpap machine.  He says he has not used his CPAP machine in a very long time.  He says he just wants to focus on losing weight.  Lumbar radiculopathy: Patient reports that he has a lumbar bulging disc.  He takes Gabapentin for the pain which helps. He is currently taking 600 mg three times a day.   Relevant past medical, surgical, family and social history reviewed and updated as indicated. Interim medical history since  our last visit reviewed. Allergies and medications reviewed and updated.  Review of Systems  Constitutional: Negative for fever or weight change.  Respiratory: Negative for cough and shortness of breath.   Cardiovascular: Negative for chest pain or palpitations.  Gastrointestinal: Negative for abdominal pain, no bowel changes.  Musculoskeletal: Negative for gait problem or joint swelling.  Positive for back pain Skin: Negative for rash.  Neurological: Negative for dizziness or headache.  No other specific complaints in a complete review of systems (except as listed in HPI above).      Objective:    BP 130/80   Pulse 96   Temp 98 F (36.7 C) (Oral)   Resp 16   Ht _1  (1.778 m)   Wt 290 lb (131.5 kg)   SpO2 97%   BMI 41.61 kg/m   Wt Readings from Last 3 Encounters:  06/03/22 290 lb (131.5 kg)  03/04/22 290 lb 9.6 oz (131.8 kg)  12/28/21 286 lb (129.7 kg)    Physical Exam  Constitutional: Patient appears well-developed and well-nourished. Obese  No distress.  HEENT:  head atraumatic, normocephalic, pupils equal and reactive to light, neck supple Cardiovascular: Normal rate, regular rhythm and normal heart sounds.  No murmur heard. No BLE edema. Pulmonary/Chest: Effort normal and breath sounds normal. No respiratory distress. Abdominal: Soft.  There is no tenderness. Psychiatric: Patient has a normal mood and affect. behavior is normal. Judgment and thought content normal.  Results for orders placed or performed in visit on 03/04/22  Lipid panel  Result Value Ref Range   Cholesterol 191 <200 mg/dL   HDL 32 (L) > OR = 40 mg/dL   Triglycerides 172 (H) <150 mg/dL   LDL Cholesterol (Calc) 130 (H) mg/dL (calc)   Total CHOL/HDL Ratio 6.0 (H) <5.0 (calc)   Non-HDL Cholesterol (Calc) 159 (H) <130 mg/dL (calc)  COMPLETE METABOLIC PANEL WITH GFR  Result Value Ref Range   Glucose, Bld 103 (H) 65 - 99 mg/dL   BUN 13 7 - 25 mg/dL   Creat 0.89 0.60 - 1.29 mg/dL   eGFR 106 > OR =  60 mL/min/1.66m   BUN/Creatinine Ratio NOT APPLICABLE 6 - 22 (calc)   Sodium 140 135 - 146 mmol/L   Potassium 4.5 3.5 - 5.3 mmol/L   Chloride 106 98 - 110 mmol/L   CO2 26 20 - 32 mmol/L   Calcium 9.0 8.6 - 10.3 mg/dL   Total Protein 6.6 6.1 - 8.1 g/dL   Albumin 4.4 3.6 - 5.1 g/dL   Globulin 2.2 1.9 - 3.7 g/dL (calc)   AG Ratio 2.0 1.0 - 2.5 (calc)   Total Bilirubin 0.6 0.2 - 1.2 mg/dL   Alkaline phosphatase (APISO) 66 36 - 130 U/L   AST 20 10 - 40 U/L   ALT 24 9 - 46 U/L  CBC with Differential/Platelet  Result Value Ref Range   WBC 8.8 3.8 - 10.8 Thousand/uL   RBC 6.00 (H) 4.20 - 5.80 Million/uL   Hemoglobin 17.1 13.2 - 17.1 g/dL   HCT 50.6 (H) 38.5 - 50.0 %   MCV 84.3 80.0 - 100.0 fL   MCH 28.5 27.0 - 33.0 pg   MCHC 33.8 32.0 - 36.0 g/dL   RDW 13.2 11.0 - 15.0 %   Platelets 320 140 - 400 Thousand/uL   MPV 10.3 7.5 - 12.5 fL   Neutro Abs 6,424 1,500 - 7,800 cells/uL   Lymphs Abs 1,487 850 - 3,900 cells/uL   Absolute Monocytes 581 200 - 950 cells/uL   Eosinophils Absolute 238 15 - 500 cells/uL   Basophils Absolute 70 0 - 200 cells/uL   Neutrophils Relative % 73 %   Total Lymphocyte 16.9 %   Monocytes Relative 6.6 %   Eosinophils Relative 2.7 %   Basophils Relative 0.8 %  Hemoglobin A1c  Result Value Ref Range   Hgb A1c MFr Bld 5.3 <5.7 % of total Hgb   Mean Plasma Glucose 105 mg/dL   eAG (mmol/L) 5.8 mmol/L      Assessment & Plan:   Problem List Items Addressed This Visit       Respiratory   Obstructive sleep apnea on CPAP    Patient reports that he does not use his CPAP machine and has not used it for many years.        Digestive   GERD (gastroesophageal reflux disease)    Continue taking over-the-counter treatment.        Nervous and Auditory   Lumbar radiculopathy    No change in condition.  Continue taking gabapentin 600 mg 3 times a day  Relevant Medications   Semaglutide-Weight Management 0.25 MG/0.5ML SOAJ     Other   Morbid obesity  (Nathan [name]) - Primary    Patient wanting to try Nathan [name].  Prescription sent to a different pharmacy.  Patient is aware that it is on backorder.      Relevant Medications   Semaglutide-Weight Management 0.25 MG/0.5ML SOAJ   Mixed hyperlipidemia    Last LDL was elevated.  Discussed with patient decreasing saturated fats in his diet.        Follow up plan: Return in about 6 months (around 12/03/2022) for follow up.

## 2022-06-03 NOTE — Assessment & Plan Note (Signed)
Last LDL was elevated.  Discussed with patient decreasing saturated fats in his diet.

## 2022-06-03 NOTE — Telephone Encounter (Signed)
Nathan Free do you want to change his wegovy or wait to see if he can get next week

## 2022-06-03 NOTE — Assessment & Plan Note (Signed)
No change in condition.  Continue taking gabapentin 600 mg 3 times a day

## 2022-06-03 NOTE — Assessment & Plan Note (Signed)
Patient reports that he does not use his CPAP machine and has not used it for many years.

## 2022-06-03 NOTE — Assessment & Plan Note (Signed)
Patient wanting to try [facility].  Prescription sent to a different pharmacy.  Patient is aware that it is on backorder.

## 2022-06-03 NOTE — Assessment & Plan Note (Signed)
Continue taking over-the-counter treatment.

## 2022-06-03 NOTE — Telephone Encounter (Signed)
Pt called and stated that the pharmacy didn't have the Rx Semaglutide-Weight Management 0.25 MG/0.5ML SOAJ / pt was advised to call about an alternative RX/ please advise

## 2022-06-03 NOTE — Telephone Encounter (Signed)
Spoke to patient, he would like to try the saxenda until he maybe able to try wegovy

## 2022-06-06 ENCOUNTER — Telehealth: Payer: Self-pay | Admitting: Nurse Practitioner

## 2022-06-06 NOTE — Telephone Encounter (Signed)
Pt is calling because his Liraglutide -Weight Management ([place]) 18 MG/3ML SOPN [[id number]] and wagevoy was denied by his insurance. [place]- [phone]

## 2022-06-07 ENCOUNTER — Other Ambulatory Visit: Payer: Self-pay

## 2022-06-08 NOTE — Telephone Encounter (Signed)
Insurance will not cover

## 2022-08-30 ENCOUNTER — Other Ambulatory Visit: Payer: Self-pay | Admitting: Nurse Practitioner

## 2022-08-31 NOTE — Telephone Encounter (Signed)
Requested medications are due for refill today.  unsure  Requested medications are on the active medications list.  yes  Last refill. 05/2022  Future visit scheduled.   no  Notes to clinic.  Medication listed as historical.    Requested Prescriptions  Pending Prescriptions Disp Refills   naproxen (NAPROSYN) 500 MG tablet [Pharmacy Med Name: NAPROXEN 500 MG TABLET] 180 tablet 1    Sig: TAKE 1 TABLET BY MOUTH 2 TIMES DAILY WITH A MEAL.     Analgesics:  NSAIDS Failed - 08/30/2022  2:23 AM      Failed - Manual Review: Labs are only required if the patient has taken medication for more than 8 weeks.      Failed - HCT in normal range and within 360 days    HCT  Date Value Ref Range Status  03/04/2022 50.6 (H) 38.5 - 50.0 % Final         Passed - Cr in normal range and within 360 days    Creat  Date Value Ref Range Status  03/04/2022 0.89 0.60 - 1.29 mg/dL Final         Passed - HGB in normal range and within 360 days    Hemoglobin  Date Value Ref Range Status  03/04/2022 17.1 13.2 - 17.1 g/dL Final         Passed - PLT in normal range and within 360 days    Platelets  Date Value Ref Range Status  03/04/2022 320 140 - 400 Thousand/uL Final         Passed - eGFR is 30 or above and within 360 days    GFR, Est African American  Date Value Ref Range Status  11/12/2020 115 > OR = 60 mL/min/1.59m Final   GFR, Est Non African American  Date Value Ref Range Status  11/12/2020 99 > OR = 60 mL/min/1.741mFinal   eGFR  Date Value Ref Range Status  03/04/2022 106 > OR = 60 mL/min/1.7316minal    Comment:    The eGFR is based on the CKD-EPI 2021 equation. To calculate  the new eGFR from a previous Creatinine or Cystatin C result, go to https://www.kidney.org/professionals/ kdoqi/gfr%5Fcalculator          Passed - Patient is not pregnant      Passed - Valid encounter within last 12 months    Recent Outpatient Visits           [facility] months [facility]   6 months ago [facility] physical [facility]   1 year ago Medication monitoring encounter   [facility]   1 year ago [facility]bar radiculopathy   [facility] Annual [facility] exam   [facility]

## 2023-02-18 IMAGING — CR DG ABDOMEN 1V
4 series · 4 of 4 positions shown · non-contrast
Comparison: None.

CLINICAL DATA: Acute left flank pain, gross hematuria.

EXAM:
ABDOMEN - 1 VIEW

[abdomen kub (1 of 4)]
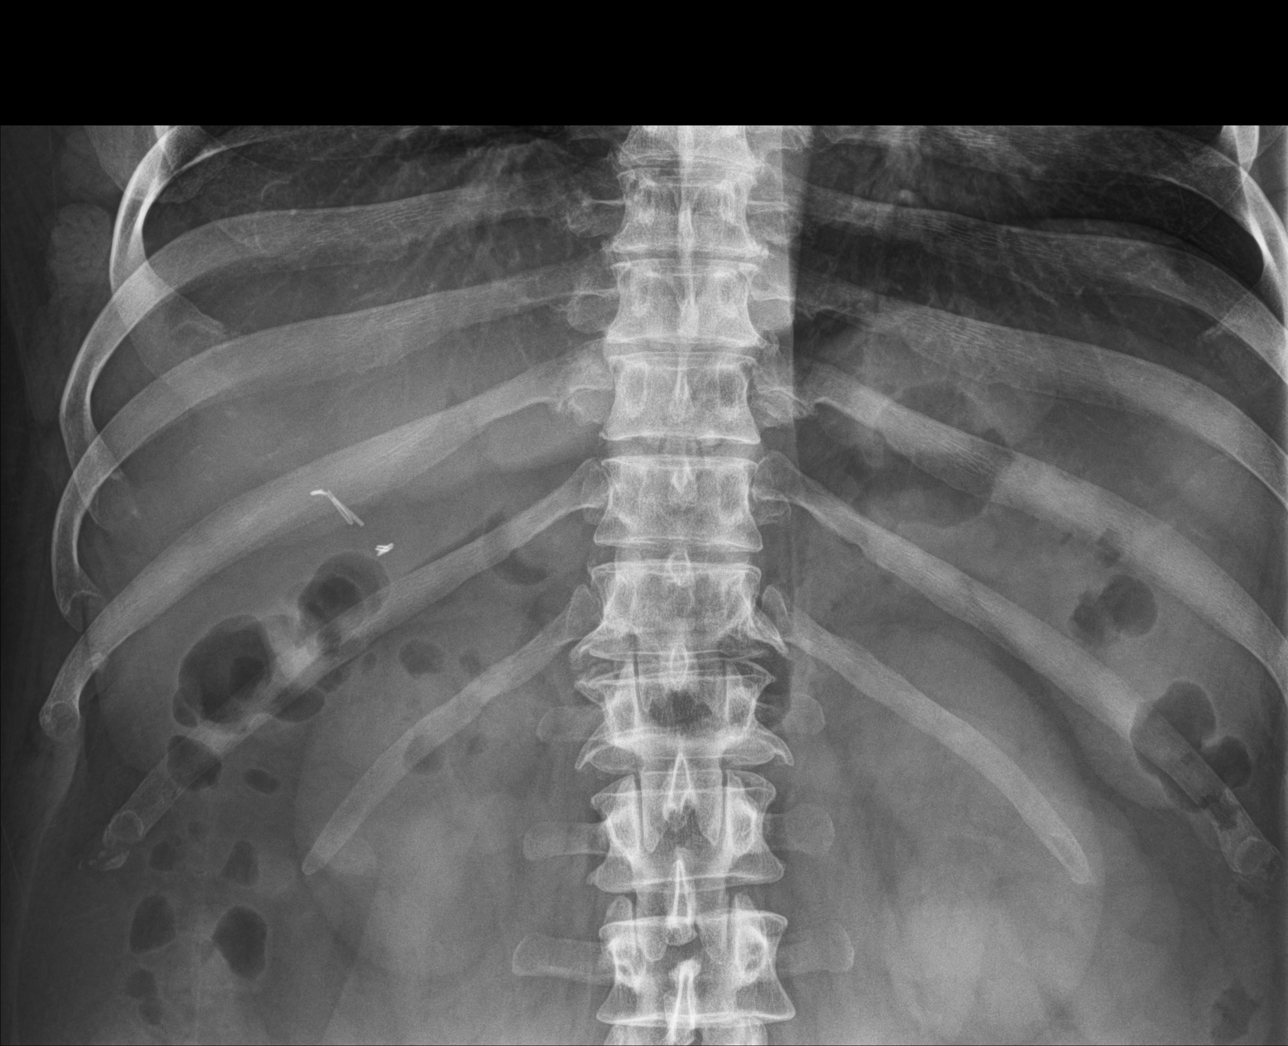

[abdomen kub (2 of 4)]
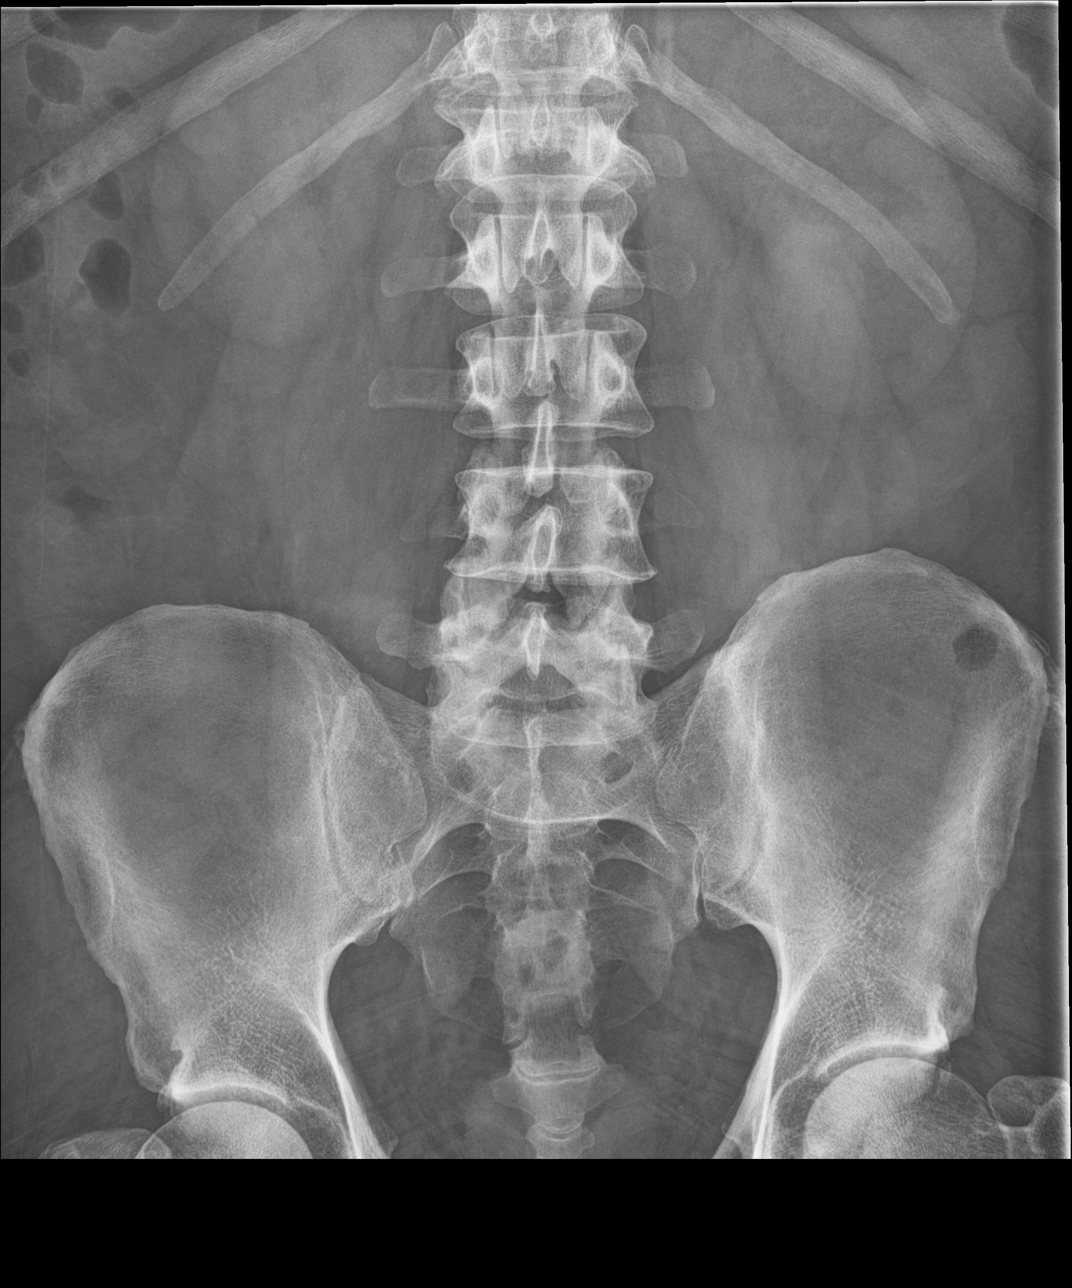

[abdomen kub (3 of 4)]
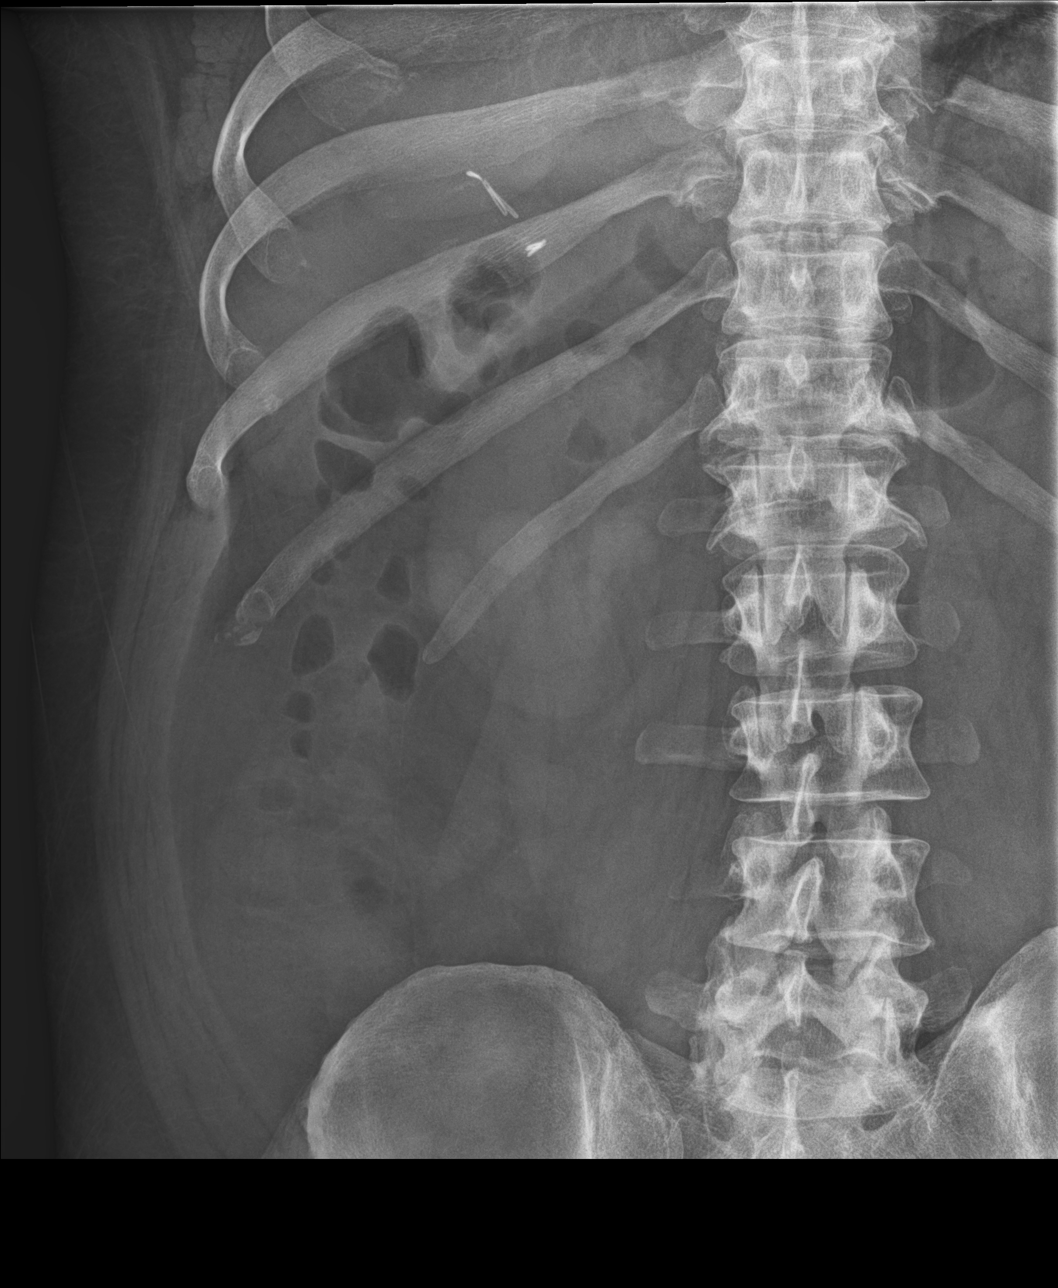

[abdomen kub (4 of 4)]
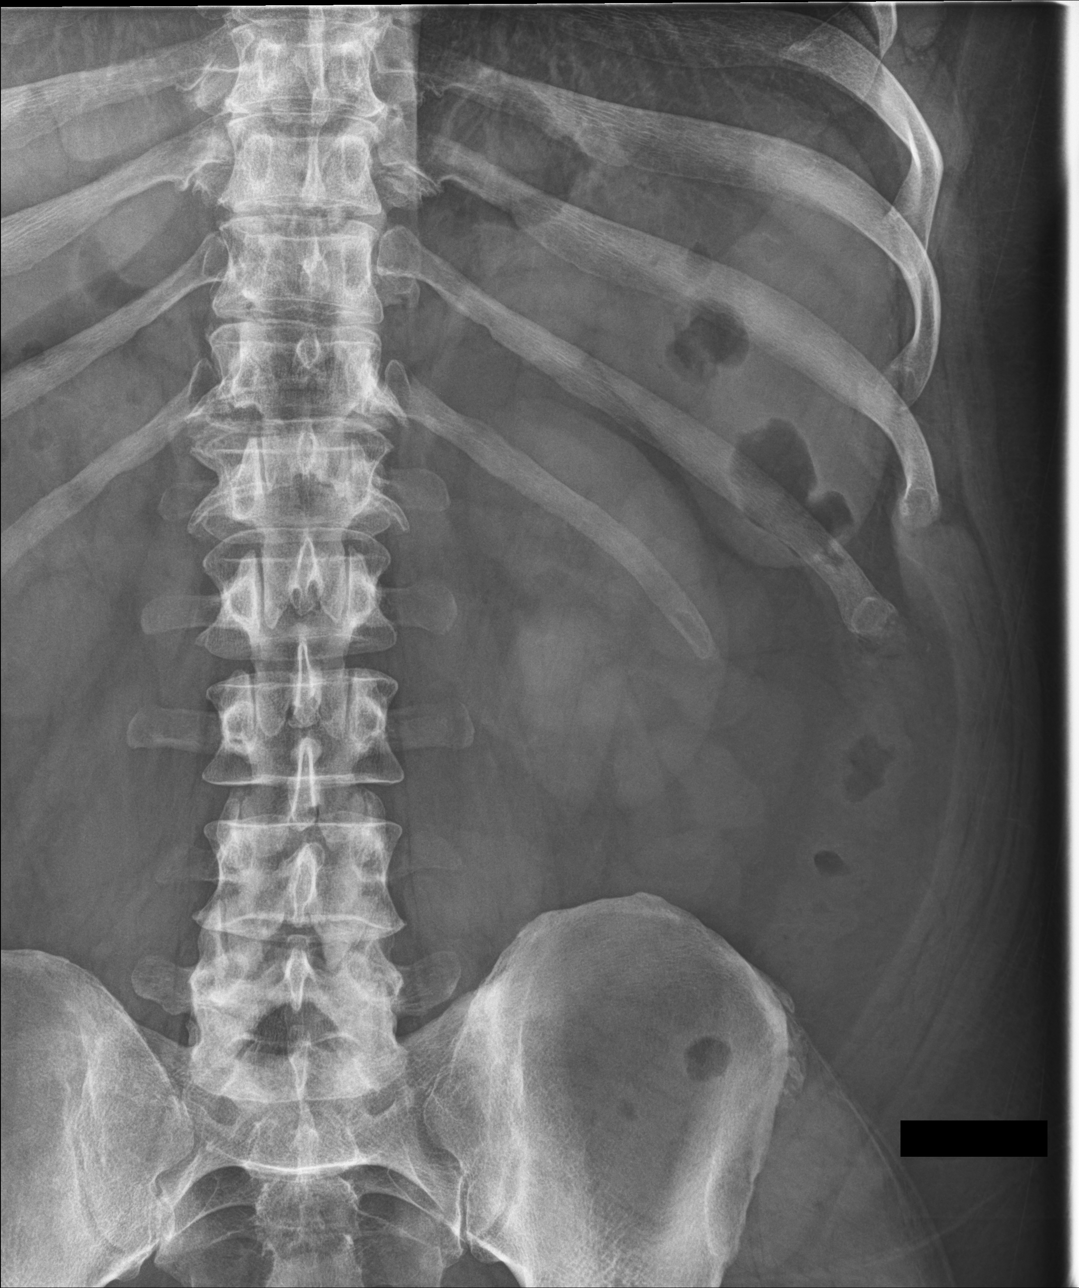

[4 of 4 positions shown; findings below may reference images not displayed]

FINDINGS: The bowel gas pattern is normal. No radio-opaque calculi or other
significant radiographic abnormality are seen.
IMPRESSION: Negative.

## 2023-09-18 ENCOUNTER — Encounter: Payer: Self-pay | Admitting: Nurse Practitioner

## 2023-09-18 ENCOUNTER — Other Ambulatory Visit: Payer: Self-pay

## 2023-09-18 ENCOUNTER — Ambulatory Visit (INDEPENDENT_AMBULATORY_CARE_PROVIDER_SITE_OTHER): Payer: BC Managed Care – PPO | Admitting: Nurse Practitioner

## 2023-09-18 ENCOUNTER — Ambulatory Visit
Admission: RE | Admit: 2023-09-18 | Discharge: 2023-09-18 | Disposition: A | Discharge status: Home / Self Care | Payer: BC Managed Care – PPO | Attending: Nurse Practitioner | Admitting: Nurse Practitioner

## 2023-09-18 ENCOUNTER — Ambulatory Visit
Admission: RE | Admit: 2023-09-18 | Discharge: 2023-09-18 | Disposition: A | Discharge status: Home / Self Care | Payer: BC Managed Care – PPO | Source: Ambulatory Visit | Attending: Nurse Practitioner | Admitting: Nurse Practitioner

## 2023-09-18 VITALS — BP 124/72 | HR 94 | Temp 98.2°F | Resp 16 | Ht 70.0 in | Wt 280.8 lb

## 2023-09-18 DIAGNOSIS — M5442 Lumbago with sciatica, left side: Secondary | ICD-10-CM

## 2023-09-18 DIAGNOSIS — Z130 Encounter for screening for diseases of the blood and blood-forming organs and certain disorders involving the immune mechanism: Secondary | ICD-10-CM

## 2023-09-18 DIAGNOSIS — M25551 Pain in right hip: Secondary | ICD-10-CM | POA: Diagnosis not present

## 2023-09-18 DIAGNOSIS — E782 Mixed hyperlipidemia: Secondary | ICD-10-CM

## 2023-09-18 DIAGNOSIS — Z131 Encounter for screening for diabetes mellitus: Secondary | ICD-10-CM

## 2023-09-18 DIAGNOSIS — M545 Low back pain, unspecified: Secondary | ICD-10-CM | POA: Diagnosis not present

## 2023-09-18 MED ORDER — PREDNISONE 10 MG (21) PO TBPK: ORAL_TABLET | ORAL | 0 refills | Status: DC

## 2023-09-18 MED ORDER — METHOCARBAMOL 500 MG PO TABS: 500.0000 mg | ORAL_TABLET | Freq: Two times a day (BID) | ORAL | 0 refills | Status: DC | PRN

## 2023-09-18 MED ORDER — ZEPBOUND 2.5 MG/0.5ML ~~LOC~~ SOAJ: 2.5000 mg | SUBCUTANEOUS | 0 refills | Status: DC

## 2023-09-18 MED ORDER — NAPROXEN 500 MG PO TABS: 500.0000 mg | ORAL_TABLET | Freq: Two times a day (BID) | ORAL | 0 refills | Status: DC

## 2023-09-18 NOTE — Assessment & Plan Note (Signed)
Would like to try and get approved for zepbound.

## 2023-09-18 NOTE — Progress Notes (Signed)
BP 124/72   Pulse 94   Temp 98.2 F (36.8 C) (Oral)   Resp 16   Ht 5\' 10"  (1.778 m)   Wt 280 lb 12.8 oz (127.4 kg)   SpO2 94%   BMI 40.29 kg/m    Subjective:    [name], male    DOB: [birth year], 47 y.o.   MRN: [id number]  HPI: Nathan [name] is a 47 y.o. male  Chief Complaint  Patient presents with   Back Pain    Low backpain   Leg Pain    Left leg pain numbeness   Hip Pain    Right hip pain, cannot walk at times   Low back pain with sciatica: patient reports it started a year ago. He denies any trauma, no incontinence.  He reports he does have some numbness going down his left leg.  He also reports he has right hip pain.  He reports that the right hip pain started two weeks ago.  He reports that he has not taken anything for the pain. He says that he just deals with the pain. He says over the last two weeks he has had a pain in his right hip that every time he goes to pick something up it exacerbates the pain.  Nothing makes the pain better, movement and picking up things makes the pain worse.  Will treat with nsaid, muscle relaxer, steroid taper.  Will get xray of lumbar.  Obesity: patient would like to try and get approved for weight loss medication. He reports he has worked really hard to lose weight but has had no luck. He reports he exercises and watches his diet.  He denies being on medication in the past. He has tried to be approved before but insurance would not cover. No family history of thyroid cancer or personal history of pancreatitis.       Relevant past medical, surgical, family and social history reviewed and updated as indicated. Interim medical history since our last visit reviewed. Allergies and medications reviewed and updated.  Review of Systems  Ten systems reviewed and is negative except as mentioned in HPI       Objective:    BP 124/72   Pulse 94   Temp 98.2 F (36.8 C) (Oral)   Resp 16   Ht 5\' 10"  (1.778 m)   Wt 280 lb 12.8  oz (127.4 kg)   SpO2 94%   BMI 40.29 kg/m   Wt Readings from Last 3 Encounters:  09/18/23 280 lb 12.8 oz (127.4 kg)  06/03/22 290 lb (131.5 kg)  03/04/22 290 lb 9.6 oz (131.8 kg)    Physical Exam  Constitutional: Patient appears well-developed and well-nourished. Obese  No distress.  HEENT: head atraumatic, normocephalic, pupils equal and reactive to light, neck supple Cardiovascular: Normal rate, regular rhythm and normal heart sounds.  No murmur heard. No BLE edema. Pulmonary/Chest: Effort normal and breath sounds normal. No respiratory distress. Abdominal: Soft.  There is no tenderness. MSK: lumbar tenderness, positive straight leg test Psychiatric: Patient has a normal mood and affect. behavior is normal. Judgment and thought content normal.  Results for orders placed or performed in visit on 03/04/22  Lipid panel  Result Value Ref Range   Cholesterol 191 <200 mg/dL   HDL 32 (L) > OR = 40 mg/dL   Triglycerides 295 (H) <150 mg/dL   LDL Cholesterol (Calc) 130 (H) mg/dL (calc)   Total CHOL/HDL Ratio 6.0 (H) <5.0 (calc)  Non-HDL Cholesterol (Calc) 159 (H) <130 mg/dL (calc)  COMPLETE METABOLIC PANEL WITH GFR  Result Value Ref Range   Glucose, Bld 103 (H) 65 - 99 mg/dL   BUN 13 7 - 25 mg/dL   Creat 3.29 5.18 - 8.41 mg/dL   eGFR 660 > OR = 60 YT/KZS/0.10X3   BUN/Creatinine Ratio NOT APPLICABLE 6 - 22 (calc)   Sodium 140 135 - 146 mmol/L   Potassium 4.5 3.5 - 5.3 mmol/L   Chloride 106 98 - 110 mmol/L   CO2 26 20 - 32 mmol/L   Calcium 9.0 8.6 - 10.3 mg/dL   Total Protein 6.6 6.1 - 8.1 g/dL   Albumin 4.4 3.6 - 5.1 g/dL   Globulin 2.2 1.9 - 3.7 g/dL (calc)   AG Ratio 2.0 1.0 - 2.5 (calc)   Total Bilirubin 0.6 0.2 - 1.2 mg/dL   Alkaline phosphatase (APISO) 66 36 - 130 U/L   AST 20 10 - 40 U/L   ALT 24 9 - 46 U/L  CBC with Differential/Platelet  Result Value Ref Range   WBC 8.8 3.8 - 10.8 Thousand/uL   RBC 6.00 (H) 4.20 - 5.80 Million/uL   Hemoglobin 17.1 13.2 - 17.1 g/dL    HCT 23.5 (H) 57.3 - 50.0 %   MCV 84.3 80.0 - 100.0 fL   MCH 28.5 27.0 - 33.0 pg   MCHC 33.8 32.0 - 36.0 g/dL   RDW 22.0 25.4 - 27.0 %   Platelets 320 140 - 400 Thousand/uL   MPV 10.3 7.5 - 12.5 fL   Neutro Abs 6,424 1,500 - 7,800 cells/uL   Lymphs Abs 1,487 850 - 3,900 cells/uL   Absolute Monocytes 581 200 - 950 cells/uL   Eosinophils Absolute 238 15 - 500 cells/uL   Basophils Absolute 70 0 - 200 cells/uL   Neutrophils Relative % 73 %   Total Lymphocyte 16.9 %   Monocytes Relative 6.6 %   Eosinophils Relative 2.7 %   Basophils Relative 0.8 %  Hemoglobin A1c  Result Value Ref Range   Hgb A1c MFr Bld 5.3 <5.7 % of total Hgb   Mean Plasma Glucose 105 mg/dL   eAG (mmol/L) 5.8 mmol/L      Assessment & Plan:   Problem List Items Addressed This Visit       Other   Morbid obesity (HCC)    Would like to try and get approved for zepbound.       Relevant Medications   tirzepatide (ZEPBOUND) 2.5 MG/0.5ML Pen   Other Relevant Orders   TSH   Mixed hyperlipidemia   Relevant Orders   Lipid panel   Other Visit Diagnoses     Acute bilateral low back pain with left-sided sciatica    -  Primary   get lumbar xray, start naproxen, robaxin and steroid taper, can do heat therapy, if no improvment will refer to ortho   Relevant Medications   tirzepatide (ZEPBOUND) 2.5 MG/0.5ML Pen   predniSONE (STERAPRED UNI-PAK 21 TAB) 10 MG (21) TBPK tablet   naproxen (NAPROSYN) 500 MG tablet   methocarbamol (ROBAXIN) 500 MG tablet   Other Relevant Orders   DG Lumbar Spine Complete   Pain of right hip       get lumbar xray, start naproxen, robaxin and steroid taper, can do heat therapy, if no improvment will refer to ortho   Relevant Medications   predniSONE (STERAPRED UNI-PAK 21 TAB) 10 MG (21) TBPK tablet   naproxen (NAPROSYN) 500 MG tablet  methocarbamol (ROBAXIN) 500 MG tablet   Other Relevant Orders   DG Lumbar Spine Complete   Screening for diabetes mellitus       Relevant Orders    COMPLETE METABOLIC PANEL WITH GFR   Hemoglobin A1c   Screening for deficiency anemia       Relevant Orders   CBC with Differential/Platelet        Follow up plan: Return if symptoms worsen or fail to improve.

## 2023-09-19 ENCOUNTER — Encounter: Payer: Self-pay | Admitting: Nurse Practitioner

## 2023-09-26 ENCOUNTER — Telehealth: Payer: Self-pay | Admitting: Nurse Practitioner

## 2023-09-26 NOTE — Telephone Encounter (Signed)
Patient called in to get his results from the images he had done on 9/23. If possible please f/u with Radiology and then call patient with an update

## 2023-09-27 NOTE — Telephone Encounter (Signed)
Called Radiology is behind schedule. Will send asap

## 2023-09-28 ENCOUNTER — Encounter: Payer: Self-pay | Admitting: Nurse Practitioner

## 2023-09-28 ENCOUNTER — Other Ambulatory Visit: Payer: Self-pay | Admitting: Nurse Practitioner

## 2023-09-28 DIAGNOSIS — M779 Enthesopathy, unspecified: Secondary | ICD-10-CM

## 2023-09-28 DIAGNOSIS — M5416 Radiculopathy, lumbar region: Secondary | ICD-10-CM

## 2023-10-10 NOTE — Progress Notes (Signed)
Name: Nathan [name]   MRN: [id number]    DOB: 12-31-1975   Date:10/13/2023       Progress Note  Subjective  Chief Complaint  Chief Complaint  Patient presents with   Annual Exam    HPI  Patient presents for annual CPE , new issue  Anxiety: his wife pulled me aside and stated that he has been having panic attacks. He says it happens sometimes.  He does have increased stress. Will trial lexapro 10 mg daily, follow up in 4 weeks.    Back pain: patient previously seen for back pain, xray performed and referral placed to ortho, however patient has not gone to see them yet. Encouraged patient to walk over and do a walk in at [facility] [facility] today.  EXAM: LUMBAR SPINE - COMPLETE 4+ VIEW   COMPARISON:  None Available.   FINDINGS: Normal alignment of lumbar vertebral bodies. No loss of vertebral body height or disc height. No pars fracture. No subluxation. endplate spurring at L4. Postcholecystectomy.   IMPRESSION: 1. No acute findings lumbar spine. 2. Mild endplate spurring at L4.  IPSS Questionnaire (AUA-7): Over the past month.   1)  How often have you had a sensation of not emptying your bladder completely after you finish urinating?  0 - Not at all  2)  How often have you had to urinate again less than two hours after you finished urinating? 0 - Not at all  3)  How often have you found you stopped and started again several times when you urinated?  0 - Not at all  4) How difficult have you found it to postpone urination?  0 - Not at all  5) How often have you had a weak urinary stream?  0 - Not at all  6) How often have you had to push or strain to begin urination?  0 - Not at all  7) How many times did you most typically get up to urinate from the time you went to bed until the time you got up in the morning?  0 - None  Total score:  0-7 mildly symptomatic   8-19 moderately symptomatic   20-35 severely symptomatic     Diet: Regular  cutting out sugars and breads Exercise:  walking 2 miles a day Last Dental Exam: 2020 Last Eye Exam: 2023  Depression: phq 9 is negative    10/13/2023   10:52 AM 10/13/2023   10:33 AM 09/18/2023   11:34 AM 06/03/2022    8:30 AM 03/04/2022    8:15 AM  Depression screen PHQ 2/9  Decreased Interest 0 0 0 0 0  Down, Depressed, Hopeless 0 0 0 0 0  PHQ - 2 Score 0 0 0 0 0  Altered sleeping 0    0  Tired, decreased energy 1    0  Change in appetite 0    0  Feeling bad or failure about yourself  0    0  Trouble concentrating 1    0  Moving slowly or fidgety/restless 1    0  Suicidal thoughts 0    0  PHQ-9 Score 3    0  Difficult doing work/chores Not difficult at all    Not difficult at all    Hypertension:  BP Readings from Last 3 Encounters:  10/13/23 118/78  09/18/23 124/72  06/03/22 130/80    Obesity: Wt Readings from Last 3 Encounters:  10/13/23 275 lb 6.4 oz (124.9 kg)  09/18/23 280 lb  12.8 oz (127.4 kg)  06/03/22 290 lb (131.5 kg)   BMI Readings from Last 3 Encounters:  10/13/23 39.52 kg/m  09/18/23 40.29 kg/m  06/03/22 41.61 kg/m     Lipids:  Lab Results  Component Value Date   CHOL 191 03/04/2022   CHOL 177 11/12/2020   CHOL 199 01/11/2019   Lab Results  Component Value Date   HDL 32 (L) 03/04/2022   HDL 29 (L) 11/12/2020   HDL 39 (L) 01/11/2019   Lab Results  Component Value Date   LDLCALC 130 (H) 03/04/2022   LDLCALC 115 (H) 11/12/2020   LDLCALC 126 (H) 01/11/2019   Lab Results  Component Value Date   TRIG 172 (H) 03/04/2022   TRIG 210 (H) 11/12/2020   TRIG 202 (H) 01/11/2019   Lab Results  Component Value Date   CHOLHDL 6.0 (H) 03/04/2022   CHOLHDL 6.1 (H) 11/12/2020   CHOLHDL 5.1 (H) 01/11/2019   No results found for: "LDLDIRECT" Glucose:  Glucose, Bld  Date Value Ref Range Status  03/04/2022 103 (H) 65 - 99 mg/dL Final    Comment:    .            Fasting reference interval . For someone without known diabetes, a glucose value between 100 and 125 mg/dL is consistent  with prediabetes and should be confirmed with a follow-up test. .   07/29/2021 75 65 - 99 mg/dL Final    Comment:    .            Fasting reference interval .   11/12/2020 101 (H) 65 - 99 mg/dL Final    Comment:    .            Fasting reference interval . For someone without known diabetes, a glucose value between 100 and 125 mg/dL is consistent with prediabetes and should be confirmed with a follow-up test. .     Flowsheet Row Office Visit from 10/13/2023 in [facility]  AUDIT-C Score 0       Married STD testing and prevention (HIV/chl/gon/syphilis):  no , completed Sexual history: yes Hep C Screening: completed Skin cancer: Discussed monitoring for atypical lesions Colorectal cancer: 11/16/2020 Prostate cancer:  no No results found for: "PSA"   Lung cancer:  Low Dose CT Chest recommended if Age 50-80 years, 30 pack-year currently smoking OR have quit w/in 15years. Patient  not applicable a candidate for screening   AAA: The USPSTF recommends one-time screening with ultrasonography in men ages 39 to 75 years who have ever smoked. Patient   not applicable, a candidate for screening  ECG:  11/12/2015  Vaccines:   HPV: up to at age 49 , ask insurance if age between 49-45  Shingrix: 37-64 yo and ask insurance if covered when patient above 105 yo Pneumonia:  educated and discussed with patient. Tdap: 03/19/2021 Flu: 10/07/2023 COVID-19:11/28/2020, 05/26/2020, 05/02/2020  Advanced Care Planning: A voluntary discussion about advance care planning including the explanation and discussion of advance directives.  Discussed health care proxy and Living will, and the patient was able to identify a health care proxy as wife.  Patient does not have a living will and power of attorney of health care   Patient Active Problem List   Diagnosis Date Noted   Mixed hyperlipidemia 06/03/2022   Lumbar radiculopathy 06/03/2022   Bulging lumbar disc  07/29/2021   History of colonic polyps    Elevated BP without diagnosis of hypertension 10/28/2020  Vitamin D deficiency 01/15/2019   Bone spur of foot 03/16/2018   Hemorrhoid 03/16/2018   Family history of colon cancer    GERD (gastroesophageal reflux disease) 09/05/2017   Bilateral elbow joint pain 11/23/2016   Obstructive sleep apnea on CPAP 11/12/2015   Morbid obesity (HCC) 11/12/2015   Syncope and collapse 09/24/2015   Snoring 09/24/2015   Abnormal EKG 09/24/2015    Past Surgical History:  Procedure Laterality Date   COLONOSCOPY WITH PROPOFOL N/A 10/20/2017   Procedure: COLONOSCOPY WITH PROPOFOL;  Surgeon: Midge Minium, MD;  [facility];  Service: Endoscopy;  Laterality: N/A;   COLONOSCOPY WITH PROPOFOL N/A 11/16/2020   Procedure: COLONOSCOPY WITH BIOPSY;  Surgeon: Midge Minium, MD;  Location: [facility];  Service: Endoscopy;  Laterality: N/A;  Sleep apnea priority 4   ESOPHAGOGASTRODUODENOSCOPY (EGD) WITH PROPOFOL N/A 10/20/2017   Procedure: ESOPHAGOGASTRODUODENOSCOPY (EGD) WITH PROPOFOL;  Surgeon: Midge Minium, MD;  Location: [facility];  Service: Endoscopy;  Laterality: N/A;  sleep apnea   GALLBLADDER SURGERY     POLYPECTOMY N/A 11/16/2020   Procedure: POLYPECTOMY;  Surgeon: Midge Minium, MD;  Location: [facility];  Service: Endoscopy;  Laterality: N/A;    Family History  Problem Relation Age of Onset   Cancer Mother    Stroke Father    Heart disease Father    Diabetes Father     Social History   Socioeconomic History   Marital status: Married    Spouse name: Not on file   Number of children: Not on file   Years of education: Not on file   Highest education level: GED or equivalent  Occupational History   Not on file  Tobacco Use   Smoking status: Former    Current packs/day: 0.00    Average packs/day: 1 pack/day for 16.0 years (16.0 ttl pk-yrs)    Types: Cigarettes    Start date: 83    Quit date: 2008     Years since quitting: 16.8   Smokeless tobacco: Current    Types: Snuff, Chew  Vaping Use   Vaping status: Never Used  Substance and Sexual Activity   Alcohol use: Yes    Comment: rare   Drug use: No   Sexual activity: Not Currently  Other Topics Concern   Not on file  Social History Narrative   Not on file   Social Determinants of Health   Financial Resource Strain: Medium Risk (10/13/2023)   Overall Financial Resource Strain (CARDIA)    Difficulty of Paying Living Expenses: Somewhat hard  Food Insecurity: No Food Insecurity (10/13/2023)   Hunger Vital Sign    Worried About Running Out of Food in the Last Year: Never true    Ran Out of Food in the Last Year: Never true  Recent Concern: Food Insecurity - Food Insecurity Present (10/13/2023)   Hunger Vital Sign    Worried About Running Out of Food in the Last Year: Sometimes true    Ran Out of Food in the Last Year: Sometimes true  Transportation Needs: No Transportation Needs (10/13/2023)   PRAPARE - Administrator, Civil Service (Medical): No    Lack of Transportation (Non-Medical): No  Physical Activity: Unknown (10/13/2023)   Exercise Vital Sign    Days of Exercise per Week: Patient declined    Minutes of Exercise per Session: Not on file  Stress: No Stress Concern Present (10/13/2023)   [employer] of Occupational Health - Occupational Stress Questionnaire  Feeling of Stress : Only a little  Social Connections: Moderately Integrated (10/13/2023)   Social Connection and Isolation Panel [NHANES]    Frequency of Communication with Friends and Family: More than three times a week    Frequency of Social Gatherings with Friends and Family: More than three times a week    Attends Religious Services: 1 to 4 times per year    Active Member of [employer] or Organizations: No    Attends Banker Meetings: Never    Marital Status: Married  Catering manager Violence: Not At Risk (10/13/2023)    Humiliation, Afraid, Rape, and Kick questionnaire    Fear of Current or Ex-Partner: No    Emotionally Abused: No    Physically Abused: No    Sexually Abused: No     Current Outpatient Medications:    escitalopram (LEXAPRO) 10 MG tablet, Take 1 tablet (10 mg total) by mouth daily., Disp: 30 tablet, Rfl: 0   gabapentin (NEURONTIN) 300 MG capsule, TAKE 2 CAPSULES BY MOUTH 3 TIMES A DAY, Disp: 180 capsule, Rfl: 1   naproxen (NAPROSYN) 500 MG tablet, Take 1 tablet (500 mg total) by mouth 2 (two) times daily with a meal., Disp: 60 tablet, Rfl: 0   omeprazole (PRILOSEC) 20 MG capsule, Take 20 mg by mouth daily., Disp: , Rfl:   No Known Allergies   ROS  Constitutional: Negative for fever or weight change.  Respiratory: Negative for cough and shortness of breath.   Cardiovascular: Negative for chest pain or palpitations.  Gastrointestinal: Negative for abdominal pain, no bowel changes.  Musculoskeletal: Negative for gait problem or joint swelling.  Skin: Negative for rash.  Neurological: Negative for dizziness or headache.  No other specific complaints in a complete review of systems (except as listed in HPI above).    Objective  Vitals:   10/13/23 1032  BP: 118/78  Pulse: 84  Resp: 16  Temp: 97.8 F (36.6 C)  TempSrc: Oral  SpO2: 98%  Weight: 275 lb 6.4 oz (124.9 kg)  Height: 5\' 10"  (1.778 m)    Body mass index is 39.52 kg/m.  Physical Exam Constitutional: Patient appears well-developed and well-nourished. No distress.  HENT: Head: Normocephalic and atraumatic. Ears: B TMs ok, no erythema or effusion; Nose: Nose normal. Mouth/Throat: Oropharynx is clear and moist. No oropharyngeal exudate.  Eyes: Conjunctivae and EOM are normal. Pupils are equal, round, and reactive to light. No scleral icterus.  Neck: Normal range of motion. Neck supple. No JVD present. No thyromegaly present.  Cardiovascular: Normal rate, regular rhythm and normal heart sounds.  No murmur heard. No BLE  edema. Pulmonary/Chest: Effort normal and breath sounds normal. No respiratory distress. Abdominal: Soft. Bowel sounds are normal, no distension. There is no tenderness. no masses Musculoskeletal: Normal range of motion, no joint effusions. No gross deformities Neurological: he is alert and oriented to person, place, and time. No cranial nerve deficit. Coordination, balance, strength, speech and gait are normal.  Skin: Skin is warm and dry. No rash noted. No erythema.  Psychiatric: Patient has a normal mood and affect. behavior is normal. Judgment and thought content normal.   No results found for this or any previous visit (from the past 2160 hour(s)).   Fall Risk:    10/13/2023   10:33 AM 09/18/2023   11:34 AM 06/03/2022    8:30 AM 03/04/2022    8:15 AM 07/29/2021    3:55 PM  Fall Risk   Falls in the past year? 0 0  0 0 0  Number falls in past yr: 0 0 0 0 0  Injury with Fall? 0 0 0 0 0  Risk for fall due to : No Fall Risks No Fall Risks No Fall Risks    Follow up Falls prevention discussed Falls prevention discussed Falls prevention discussed Falls evaluation completed Falls evaluation completed     Functional Status Survey: Is the patient deaf or have difficulty hearing?: No Does the patient have difficulty seeing, even when wearing glasses/contacts?: No Does the patient have difficulty concentrating, remembering, or making decisions?: No Does the patient have difficulty walking or climbing stairs?: No Does the patient have difficulty dressing or bathing?: No Does the patient have difficulty doing errands alone such as visiting a doctor's office or shopping?: No    Assessment & Plan  1. Annual physical exam  - CBC with Differential/Platelet - COMPLETE METABOLIC PANEL WITH GFR - Lipid panel - Hemoglobin A1c - TSH - PSA  2. Mixed hyperlipidemia  - Lipid panel  3. Morbid obesity (HCC)  - CBC with Differential/Platelet - COMPLETE METABOLIC PANEL WITH GFR - Lipid  panel - Hemoglobin A1c - TSH  4. Screening for diabetes mellitus  - COMPLETE METABOLIC PANEL WITH GFR - Hemoglobin A1c  5. Screening for deficiency anemia  - CBC with Differential/Platelet  6. Screening for prostate cancer  - PSA  7. Anxiety Start lexapro, follow up in 4 weeks - escitalopram (LEXAPRO) 10 MG tablet; Take 1 tablet (10 mg total) by mouth daily.  Dispense: 30 tablet; Refill: 0    -Prostate cancer screening and PSA options (with potential risks and benefits of testing vs not testing) were discussed along with recent recs/guidelines. -USPSTF grade A and B recommendations reviewed with patient; age-appropriate recommendations, preventive care, screening tests, etc discussed and encouraged; healthy living encouraged; see AVS for patient education given to patient -Discussed importance of 150 minutes of physical activity weekly, eat two servings of fish weekly, eat one serving of tree nuts ( cashews, pistachios, pecans, almonds.Marland Kitchen) every other day, eat 6 servings of fruit/vegetables daily and drink plenty of water and avoid sweet beverages.  -Reviewed Health Maintenance: yes

## 2023-10-13 ENCOUNTER — Other Ambulatory Visit: Payer: Self-pay

## 2023-10-13 ENCOUNTER — Ambulatory Visit (INDEPENDENT_AMBULATORY_CARE_PROVIDER_SITE_OTHER): Payer: BC Managed Care – PPO | Admitting: Nurse Practitioner

## 2023-10-13 VITALS — BP 118/78 | HR 84 | Temp 97.8°F | Resp 16 | Ht 70.0 in | Wt 275.4 lb

## 2023-10-13 DIAGNOSIS — Z125 Encounter for screening for malignant neoplasm of prostate: Secondary | ICD-10-CM

## 2023-10-13 DIAGNOSIS — Z130 Encounter for screening for diseases of the blood and blood-forming organs and certain disorders involving the immune mechanism: Secondary | ICD-10-CM

## 2023-10-13 DIAGNOSIS — E782 Mixed hyperlipidemia: Secondary | ICD-10-CM

## 2023-10-13 DIAGNOSIS — Z0000 Encounter for general adult medical examination without abnormal findings: Secondary | ICD-10-CM | POA: Diagnosis not present

## 2023-10-13 DIAGNOSIS — Z131 Encounter for screening for diabetes mellitus: Secondary | ICD-10-CM | POA: Diagnosis not present

## 2023-10-13 DIAGNOSIS — F419 Anxiety disorder, unspecified: Secondary | ICD-10-CM | POA: Insufficient documentation

## 2023-10-13 MED ORDER — ESCITALOPRAM OXALATE 10 MG PO TABS: 10.0000 mg | ORAL_TABLET | Freq: Every day | ORAL | 0 refills | Status: DC

## 2023-10-14 LAB — COMPLETE METABOLIC PANEL WITH GFR
AG Ratio: 2 (calc) (ref 1.0–2.5)
ALT: 22 U/L (ref 9–46)
AST: 18 U/L (ref 10–40)
Albumin: 4.5 g/dL (ref 3.6–5.1)
Alkaline phosphatase (APISO): 71 U/L (ref 36–130)
BUN: 18 mg/dL (ref 7–25)
CO2: 30 mmol/L (ref 20–32)
Calcium: 9.5 mg/dL (ref 8.6–10.3)
Chloride: 105 mmol/L (ref 98–110)
Creat: 0.95 mg/dL (ref 0.60–1.29)
Globulin: 2.2 g/dL (ref 1.9–3.7)
Glucose, Bld: 87 mg/dL (ref 65–99)
Potassium: 5.1 mmol/L (ref 3.5–5.3)
Sodium: 140 mmol/L (ref 135–146)
Total Bilirubin: 0.5 mg/dL (ref 0.2–1.2)
Total Protein: 6.7 g/dL (ref 6.1–8.1)
eGFR: 98 mL/min/{1.73_m2} (ref >=60)

## 2023-10-14 LAB — CBC WITH DIFFERENTIAL/PLATELET
Absolute Lymphocytes: 1634 {cells}/uL (ref 850–3900)
Absolute Monocytes: 570 {cells}/uL (ref 200–950)
Basophils Absolute: 68 {cells}/uL (ref 0–200)
Basophils Relative: 0.9 %
Eosinophils Absolute: 190 {cells}/uL (ref 15–500)
Eosinophils Relative: 2.5 %
HCT: 48.5 % (ref 38.5–50.0)
Hemoglobin: 16.3 g/dL (ref 13.2–17.1)
MCH: 28.5 pg (ref 27.0–33.0)
MCHC: 33.6 g/dL (ref 32.0–36.0)
MCV: 84.9 fL (ref 80.0–100.0)
MPV: 10.4 fL (ref 7.5–12.5)
Monocytes Relative: 7.5 %
Neutro Abs: 5138 {cells}/uL (ref 1500–7800)
Neutrophils Relative %: 67.6 %
Platelets: 287 10*3/uL (ref 140–400)
RBC: 5.71 10*6/uL (ref 4.20–5.80)
RDW: 13 % (ref 11.0–15.0)
Total Lymphocyte: 21.5 %
WBC: 7.6 10*3/uL (ref 3.8–10.8)

## 2023-10-14 LAB — LIPID PANEL
Cholesterol: 197 mg/dL (ref <200)
HDL: 28 mg/dL — ABNORMAL LOW (ref >=40)
LDL Cholesterol (Calc): 130 mg/dL — ABNORMAL HIGH
Non-HDL Cholesterol (Calc): 169 mg/dL — ABNORMAL HIGH (ref <130)
Total CHOL/HDL Ratio: 7 (calc) — ABNORMAL HIGH (ref <5.0)
Triglycerides: 243 mg/dL — ABNORMAL HIGH (ref <150)

## 2023-10-14 LAB — PSA: PSA: 0.54 ng/mL (ref <=4.00)

## 2023-10-14 LAB — TSH: TSH: 1.52 m[IU]/L (ref 0.40–4.50)

## 2023-10-14 LAB — HEMOGLOBIN A1C
Hgb A1c MFr Bld: 5.4 %{Hb} (ref <5.7)
Mean Plasma Glucose: 108 mg/dL
eAG (mmol/L): 6 mmol/L

## 2023-11-05 ENCOUNTER — Other Ambulatory Visit: Payer: Self-pay | Admitting: Nurse Practitioner

## 2023-11-05 DIAGNOSIS — F419 Anxiety disorder, unspecified: Secondary | ICD-10-CM

## 2023-11-07 NOTE — Telephone Encounter (Signed)
Requested Prescriptions  Pending Prescriptions Disp Refills   escitalopram (LEXAPRO) 10 MG tablet [Pharmacy Med Name: ESCITALOPRAM 10 MG TABLET] 90 tablet 0    Sig: TAKE 1 TABLET BY MOUTH EVERY DAY     Psychiatry:  Antidepressants - SSRI Passed - 11/05/2023 11:30 AM      Passed - Valid encounter within last 6 months    Recent Outpatient Visits           3 weeks ago Annual physical exam   [facility] Della Goo F, FNP   1 month ago Acute bilateral low back pain with left-sided sciatica   [facility] Berniece Salines, FNP   1 year ago Morbid obesity [facility])   [facility] Berniece Salines, FNP   1 year ago Annual physical exam   [facility] Berniece Salines, FNP   2 years ago Medication monitoring encounter   [facility] Gabriel Cirri, NP       Future Appointments             In 3 days Zane Herald, Rudolpho Sevin, FNP [facility]

## 2023-11-10 ENCOUNTER — Telehealth (INDEPENDENT_AMBULATORY_CARE_PROVIDER_SITE_OTHER): Payer: BC Managed Care – PPO | Admitting: Nurse Practitioner

## 2023-11-10 DIAGNOSIS — F419 Anxiety disorder, unspecified: Secondary | ICD-10-CM | POA: Diagnosis not present

## 2023-11-10 MED ORDER — ESCITALOPRAM OXALATE 10 MG PO TABS: 10.0000 mg | ORAL_TABLET | Freq: Every day | ORAL | 0 refills | Status: DC

## 2023-11-10 NOTE — Progress Notes (Signed)
Name: RYN SKUBAL   MRN: [id number]    DOB: 10/20/1976   Date:11/10/2023       Progress Note  Subjective  Chief Complaint  No chief complaint on file.   I connected with  Janeece Agee  on 11/10/23 at  9:20 AM EST by a video enabled telemedicine application and verified that I am speaking with the correct person using two identifiers.  I discussed the limitations of evaluation and management by telemedicine and the availability of in person appointments. The patient expressed understanding and agreed to proceed with a virtual visit  Staff also discussed with the patient that there may be a patient responsible charge related to this service. Patient Location: home Provider Location: [facility] Additional Individuals present: alone  HPI  Anxiety: started on lexapro 10 mg daily, here for four week follow up.  Patient reports that he has not been anxious at all. He says the medication has eased it off. PHQ9 and GAD scores are negative.  Denies any side effects, will continue with current treatment plan.  Refills sent in.     11/10/2023    9:04 AM 10/13/2023   10:52 AM 10/13/2023   10:33 AM 09/18/2023   11:34 AM 06/03/2022    8:30 AM  Depression screen PHQ 2/9  Decreased Interest 0 0 0 0 0  Down, Depressed, Hopeless 0 0 0 0 0  PHQ - 2 Score 0 0 0 0 0  Altered sleeping 0 0     Tired, decreased energy 0 1     Change in appetite 0 0     Feeling bad or failure about yourself  0 0     Trouble concentrating 0 1     Moving slowly or fidgety/restless 0 1     Suicidal thoughts 0 0     PHQ-9 Score 0 3     Difficult doing work/chores Not difficult at all Not difficult at all          11/10/2023    9:08 AM 10/13/2023   10:52 AM 03/04/2022    8:16 AM  GAD 7 : Generalized Anxiety Score  Nervous, Anxious, on Edge 0 1 0  Control/stop worrying 0 1 0  Worry too much - different things 0 1 0  Trouble relaxing 0 0 0  Restless 0 2 0  Easily annoyed or irritable 0 1 0  Afraid - awful might happen 0  1 0  Total GAD 7 Score 0 7 0  Anxiety Difficulty Not difficult at all Somewhat difficult Not difficult at all     Patient Active Problem List   Diagnosis Date Noted   Anxiety 10/13/2023   Mixed hyperlipidemia 06/03/2022   Lumbar radiculopathy 06/03/2022   Bulging lumbar disc 07/29/2021   History of colonic polyps    Elevated BP without diagnosis of hypertension 10/28/2020   Vitamin D deficiency 01/15/2019   Bone spur of foot 03/16/2018   Hemorrhoid 03/16/2018   Family history of colon cancer    GERD (gastroesophageal reflux disease) 09/05/2017   Bilateral elbow joint pain 11/23/2016   Obstructive sleep apnea on CPAP 11/12/2015   Morbid obesity (HCC) 11/12/2015   Syncope and collapse 09/24/2015   Snoring 09/24/2015   Abnormal EKG 09/24/2015    Social History   Tobacco Use   Smoking status: Former    Current packs/day: 0.00    Average packs/day: 1 pack/day for 16.0 years (16.0 ttl pk-yrs)    Types: Cigarettes  Start date: 40    Quit date: 2008    Years since quitting: 16.8   Smokeless tobacco: Current    Types: Snuff, Chew  Substance Use Topics   Alcohol use: Yes    Comment: rare     Current Outpatient Medications:    escitalopram (LEXAPRO) 10 MG tablet, Take 1 tablet (10 mg total) by mouth daily., Disp: 90 tablet, Rfl: 0   gabapentin (NEURONTIN) 300 MG capsule, TAKE 2 CAPSULES BY MOUTH 3 TIMES A DAY, Disp: 180 capsule, Rfl: 1   naproxen (NAPROSYN) 500 MG tablet, Take 1 tablet (500 mg total) by mouth 2 (two) times daily with a meal., Disp: 60 tablet, Rfl: 0   omeprazole (PRILOSEC) 20 MG capsule, Take 20 mg by mouth daily., Disp: , Rfl:   No Known Allergies  I personally reviewed active problem list, medication list, allergies, notes from last encounter with the patient/caregiver today.  ROS  Constitutional: Negative for fever or weight change.  Respiratory: Negative for cough and shortness of breath.   Cardiovascular: Negative for chest pain or  palpitations.  Gastrointestinal: Negative for abdominal pain, no bowel changes.  Musculoskeletal: Negative for gait problem or joint swelling.  Skin: Negative for rash.  Neurological: Negative for dizziness or headache.  No other specific complaints in a complete review of systems (except as listed in HPI above).   Objective  Virtual encounter, vitals not obtained.  There is no height or weight on file to calculate BMI.  Nursing Note and Vital Signs reviewed.  Physical Exam  Awake, alert and oriented, speaking in complete sentences  No results found for this or any previous visit (from the past 72 hour(s)).  Assessment & Plan  Problem List Items Addressed This Visit       Other   Anxiety    Improved, continue lexapro 10 mg daily      Relevant Medications   escitalopram (LEXAPRO) 10 MG tablet     -Red flags and when to present for emergency care or RTC including fever >101.15F, chest pain, shortness of breath, new/worsening/un-resolving symptoms,  reviewed with patient at time of visit. Follow up and care instructions discussed and provided in AVS. - I discussed the assessment and treatment plan with the patient. The patient was provided an opportunity to ask questions and all were answered. The patient agreed with the plan and demonstrated an understanding of the instructions.  I provided 15 minutes of non-face-to-face time during this encounter.  Berniece Salines, FNP

## 2023-11-10 NOTE — Assessment & Plan Note (Signed)
Improved, continue lexapro 10 mg daily

## 2024-01-03 ENCOUNTER — Other Ambulatory Visit: Payer: Self-pay | Admitting: Nurse Practitioner

## 2024-01-03 DIAGNOSIS — M25551 Pain in right hip: Secondary | ICD-10-CM

## 2024-01-03 DIAGNOSIS — M5442 Lumbago with sciatica, left side: Secondary | ICD-10-CM

## 2024-01-08 NOTE — Telephone Encounter (Signed)
 Requested medication (s) are due for refill today -no  Requested medication (s) are on the active medication list -no  Future visit scheduled -no  Last refill: medication no longer listed on current medication list  Notes to clinic: non delegated Rx  Requested Prescriptions  Pending Prescriptions Disp Refills   methocarbamol  (ROBAXIN ) 500 MG tablet [Pharmacy Med Name: METHOCARBAMOL  500 MG TABLET] 60 tablet 0    Sig: Take 1 tablet (500 mg total) by mouth 2 (two) times daily as needed for muscle spasms.     Not Delegated - Analgesics:  Muscle Relaxants Failed - 01/08/2024  9:10 AM      Failed - This refill cannot be delegated      Passed - Valid encounter within last 6 months    Recent Outpatient Visits           1 month ago Anxiety   [facility] Gareth Mliss FALCON, FNP   2 months ago Annual physical exam   [facility] Gareth Mliss F, FNP   3 months ago Acute bilateral low back pain with left-sided sciatica   [facility] Gareth Mliss FALCON, FNP   1 year ago Morbid obesity [facility])   [facility] Gareth Mliss FALCON, FNP   1 year ago Annual physical exam   [facility] Gareth Mliss FALCON, FNP                 Requested Prescriptions  Pending Prescriptions Disp Refills   methocarbamol  (ROBAXIN ) 500 MG tablet [Pharmacy Med Name: METHOCARBAMOL  500 MG TABLET] 60 tablet 0    Sig: Take 1 tablet (500 mg total) by mouth 2 (two) times daily as needed for muscle spasms.     Not Delegated - Analgesics:  Muscle Relaxants Failed - 01/08/2024  9:10 AM      Failed - This refill cannot be delegated      Passed - Valid encounter within last 6 months    Recent Outpatient Visits           1 month ago Anxiety   [facility] Gareth Mliss FALCON, FNP   2 months ago Annual physical exam   [facility] Gareth Mliss FALCON, FNP    3 months ago Acute bilateral low back pain with left-sided sciatica   [facility] Gareth Mliss FALCON, FNP   1 year ago Morbid obesity [facility])   [facility] Gareth Mliss FALCON, FNP   1 year ago Annual physical exam   [facility] Gareth Mliss FALCON, FNP

## 2024-05-03 ENCOUNTER — Ambulatory Visit (INDEPENDENT_AMBULATORY_CARE_PROVIDER_SITE_OTHER)

## 2024-05-03 ENCOUNTER — Encounter: Payer: Self-pay | Admitting: Emergency Medicine

## 2024-05-03 ENCOUNTER — Ambulatory Visit
Admission: EM | Admit: 2024-05-03 | Discharge: 2024-05-03 | Disposition: A | Discharge status: Home / Self Care | Attending: Physician Assistant | Admitting: Physician Assistant

## 2024-05-03 DIAGNOSIS — T07XXXA Unspecified multiple injuries, initial encounter: Secondary | ICD-10-CM

## 2024-05-03 DIAGNOSIS — M25511 Pain in right shoulder: Secondary | ICD-10-CM

## 2024-05-03 DIAGNOSIS — W19XXXA Unspecified fall, initial encounter: Secondary | ICD-10-CM

## 2024-05-03 DIAGNOSIS — S46911A Strain of unspecified muscle, fascia and tendon at shoulder and upper arm level, right arm, initial encounter: Secondary | ICD-10-CM

## 2024-05-03 MED ORDER — KETOROLAC TROMETHAMINE 30 MG/ML IJ SOLN
30.0000 mg | Freq: Once | INTRAMUSCULAR | Status: AC
  Administered 2024-05-03: 30 mg via INTRAMUSCULAR

## 2024-05-03 NOTE — ED Provider Notes (Signed)
 [facility]    CSN: [id number] Arrival date & time: 05/03/24  1400      History   Chief Complaint Chief Complaint  Patient presents with   Fall   Shoulder Pain    HPI Nathan [name] is a 48 y.o. male.   48 year old male pt, Nathan [name], presents to urgent care for evaluation of right shoulder pain after falling through porch today. Patient states he landed with right shoulder/Arm supporting body weight. Pt reports pain in shoulder joint(right), pt has several abrasions to trunk right anterior shin left hand from incident, no head strike or LOC. No treatment PTA. Pt reports decreased ROM d/t pain since incident. Pt is right hand dominate, works as Soil scientist.   The history is provided by the patient. No language interpreter was used.    Past Medical History:  Diagnosis Date   GERD (gastroesophageal reflux disease)    OSA (obstructive sleep apnea)     Patient Active Problem List   Diagnosis Date Noted   Acute pain of right shoulder 05/03/2024   Fall 05/03/2024   Abrasions of multiple sites 05/03/2024   Shoulder strain, right, initial encounter 05/03/2024   Anxiety 10/13/2023   Mixed hyperlipidemia 06/03/2022   Lumbar radiculopathy 06/03/2022   Bulging lumbar disc 07/29/2021   History of colonic polyps    Elevated BP without diagnosis of hypertension 10/28/2020   Vitamin D  deficiency 01/15/2019   Bone spur of foot 03/16/2018   Hemorrhoid 03/16/2018   Family history of colon cancer    GERD (gastroesophageal reflux disease) 09/05/2017   Bilateral elbow joint pain 11/23/2016   Obstructive sleep apnea on CPAP 11/12/2015   Morbid obesity (HCC) 11/12/2015   Syncope and collapse 09/24/2015   Snoring 09/24/2015   Abnormal EKG 09/24/2015    Past Surgical History:  Procedure Laterality Date   COLONOSCOPY WITH PROPOFOL  N/A 10/20/2017   Procedure: COLONOSCOPY WITH PROPOFOL ;  Surgeon: Marnee Sink, MD;  Location: [facility];  Service:  Endoscopy;  Laterality: N/A;   COLONOSCOPY WITH PROPOFOL  N/A 11/16/2020   Procedure: COLONOSCOPY WITH BIOPSY;  Surgeon: Marnee Sink, MD;  Location: [facility];  Service: Endoscopy;  Laterality: N/A;  Sleep apnea priority 4   ESOPHAGOGASTRODUODENOSCOPY (EGD) WITH PROPOFOL  N/A 10/20/2017   Procedure: ESOPHAGOGASTRODUODENOSCOPY (EGD) WITH PROPOFOL ;  Surgeon: Marnee Sink, MD;  Location: [facility];  Service: Endoscopy;  Laterality: N/A;  sleep apnea   GALLBLADDER SURGERY     POLYPECTOMY N/A 11/16/2020   Procedure: POLYPECTOMY;  Surgeon: Marnee Sink, MD;  Location: [facility];  Service: Endoscopy;  Laterality: N/A;       Home Medications    Prior to Admission medications   Medication Sig Start Date End Date Taking? Authorizing Provider  escitalopram  (LEXAPRO ) 10 MG tablet Take 1 tablet (10 mg total) by mouth daily. 11/10/23  Yes Quinton Buckler, FNP  gabapentin  (NEURONTIN ) 300 MG capsule TAKE 2 CAPSULES BY MOUTH 3 TIMES A DAY 05/04/22  Yes Baity, Rankin Buzzard, NP  omeprazole  (PRILOSEC) 20 MG capsule Take 20 mg by mouth daily.   Yes [provider]  naproxen  (NAPROSYN ) 500 MG tablet Take 1 tablet (500 mg total) by mouth 2 (two) times daily with a meal. 09/18/23   Quinton Buckler, FNP    Family History Family History  Problem Relation Age of Onset   Cancer Mother    Stroke Father    Heart disease Father    Diabetes Father  Social History Social History   Tobacco Use   Smoking status: Former    Current packs/day: 0.00    Average packs/day: 1 pack/day for 16.0 years (16.0 ttl pk-yrs)    Types: Cigarettes    Start date: 14    Quit date: 2008    Years since quitting: 17.3   Smokeless tobacco: Current    Types: Snuff, Chew  Vaping Use   Vaping status: Never Used  Substance Use Topics   Alcohol use: Yes    Comment: rare   Drug use: No     Allergies   Patient has no known allergies.   Review of Systems Review of Systems   Musculoskeletal:  Positive for arthralgias, joint swelling and myalgias.  Skin:  Positive for wound.  All other systems reviewed and are negative.    Physical Exam Triage Vital Signs ED Triage Vitals  Encounter Vitals Group     BP 05/03/24 1429 136/83     Systolic BP Percentile --      Diastolic BP Percentile --      Pulse Rate 05/03/24 1429 79     Resp 05/03/24 1429 15     Temp 05/03/24 1429 98.4 F (36.9 C)     Temp Source 05/03/24 1429 Oral     SpO2 05/03/24 1429 96 %     Weight 05/03/24 1426 275 lb 5.7 oz (124.9 kg)     Height 05/03/24 1426 5\' 10"  (1.778 m)     Head Circumference --      Peak Flow --      Pain Score 05/03/24 1426 9     Pain Loc --      Pain Education --      Exclude from Growth Chart --    No data found.  Updated Vital Signs BP 136/83 (BP Location: Left Arm)   Pulse 79   Temp 98.4 F (36.9 C) (Oral)   Resp 15   Ht 5\' 10"  (1.778 m)   Wt 275 lb 5.7 oz (124.9 kg)   SpO2 96%   BMI 39.51 kg/m   Visual Acuity Right Eye Distance:   Left Eye Distance:   Bilateral Distance:    Right Eye Near:   Left Eye Near:    Bilateral Near:     Physical Exam Vitals and nursing note reviewed.  Constitutional:      Appearance: Normal appearance. He is obese.  HENT:     Head: Normocephalic.  Cardiovascular:     Rate and Rhythm: Normal rate and regular rhythm.     Pulses: Normal pulses.          Radial pulses are 2+ on the right side.     Heart sounds: Normal heart sounds.  Musculoskeletal:     Right shoulder: Swelling and bony tenderness present. Decreased range of motion. Normal pulse.       Arms:  Skin:    General: Skin is warm.     Capillary Refill: Capillary refill takes less than 2 seconds.     Findings: Abrasion present.     Comments: Superficial abrasion to left dorsal hand, right anterior shin, right lateral trunk anterior  Neurological:     General: No focal deficit present.     Mental Status: He is alert and oriented to person, place,  and time.     GCS: GCS eye subscore is 4. GCS verbal subscore is 5. GCS motor subscore is 6.  Psychiatric:        Attention  and Perception: Attention normal.        Mood and Affect: Mood normal.        Speech: Speech normal.        Behavior: Behavior normal. Behavior is cooperative.      UC Treatments / Results  Labs (all labs ordered are listed, but only abnormal results are displayed) Labs Reviewed - No data to display  EKG   Radiology DG Shoulder Right Result Date: 05/03/2024 CLINICAL DATA:  Right shoulder pain after fall EXAM: RIGHT SHOULDER - 3 VIEW COMPARISON:  None Available. FINDINGS: There is no evidence of fracture or dislocation. There is no evidence of arthropathy or other focal bone abnormality. Soft tissues are unremarkable. IMPRESSION: No acute osseous abnormality Electronically Signed   By: Adrianna Horde M.D.   On: 05/03/2024 15:43    Procedures Procedures (including critical care time)  Medications Ordered in UC Medications  ketorolac  (TORADOL ) 30 MG/ML injection 30 mg (30 mg Intramuscular Given 05/03/24 1508)    Initial Impression / Assessment and Plan / UC Course  I have reviewed the triage vital signs and the nursing notes.  Pertinent labs & imaging results that were available during my care of the patient were reviewed by me and considered in my medical decision making (see chart for details).  Clinical Course as of 05/03/24 2123  Fri May 03, 2024  1500 Toradol  30 mg IM ordered for pain, ice to right shoulder [JD]  1550 Pain improved after meds [JD]    Clinical Course User Index [JD] Jaleigha Deane, Eveleen Hinds, NP   Discussed exam findings and negative x-ray results with patient, patient placed in right shoulder sling, strict go to ER precautions given, referral to Ortho for follow-up , patient verbalized understanding to this provider  Ddx: Acute pain of right shoulder,Fall,Multiple abrasions, Right shoulder strain/sprain,fracture,dislocation Final Clinical  Impressions(s) / UC Diagnoses   Final diagnoses:  Acute pain of right shoulder  Fall, initial encounter  Abrasions of multiple sites  Shoulder strain, right, initial encounter     Discharge Instructions      Your xray was negative for fracture or dislocation. Rest,ice,wear shoulder sling May alternate tylenol /ibuprofen as label directed for pain May try lidocaine  patch/gel for pain Follow up with Orthopedics-call for appt [facility]: [address] [place], [place] [address] Phone: [phone] Urgent care hours 8a-7:30p Mon-Sat If you have new or worsening issues or concerns go to the emergency room for further evaluation   ED Prescriptions   None    PDMP not reviewed this encounter.   Peter Brands, NP 05/03/24 2123

## 2024-05-03 NOTE — Discharge Instructions (Signed)
 Your xray was negative for fracture or dislocation. Rest,ice,wear shoulder sling May alternate tylenol /ibuprofen as label directed for pain May try lidocaine  patch/gel for pain Follow up with Orthopedics-call for appt [facility]: [address] [place], [place] [address] Phone: [phone] Urgent care hours 8a-7:30p Mon-Sat If you have new or worsening issues or concerns go to the emergency room for further evaluation

## 2024-05-03 NOTE — ED Notes (Signed)
 Applied ice to right shoulder

## 2024-05-03 NOTE — ED Triage Notes (Signed)
 Patient states that her fell through his porch today.  Patient c/o right shoulder pain.  Patient unable to move his right arm.

## 2024-05-15 ENCOUNTER — Other Ambulatory Visit: Payer: Self-pay | Admitting: Nurse Practitioner

## 2024-05-15 DIAGNOSIS — F419 Anxiety disorder, unspecified: Secondary | ICD-10-CM

## 2024-05-16 NOTE — Telephone Encounter (Signed)
 Requested Prescriptions  Pending Prescriptions Disp Refills   escitalopram  (LEXAPRO ) 10 MG tablet [Pharmacy Med Name: ESCITALOPRAM  10 MG TABLET] 90 tablet 0    Sig: TAKE 1 TABLET BY MOUTH EVERY DAY     Psychiatry:  Antidepressants - SSRI Failed - 05/16/2024 11:59 AM      Failed - Valid encounter within last 6 months    Recent Outpatient Visits   None     Future Appointments             Tomorrow Abram Hoguet, Monalisa Angles, FNP [facility]

## 2024-05-16 NOTE — Progress Notes (Signed)
 BP 130/78 (BP Location: Right Arm, Patient Position: Sitting, Cuff Size: Normal)   Pulse 89   Temp 97.9 F (36.6 C) (Oral)   Ht 5\' 10"  (1.778 m)   Wt 287 lb 9.6 oz (130.5 kg)   SpO2 99%   BMI 41.27 kg/m    Subjective:    [name], male    DOB: 1976-03-27, 48 y.o.   MRN: [id number]  HPI: Nathan [name] is a 48 y.o. male  Chief Complaint  Patient presents with   Obesity    Discuss weight loss medication    Medication Refill    Gabapentin     Shoulder Pain    R shoulder pain, fell through a deck on 5/9 told he pulled a muscle and is still having pain     Discussed the use of AI scribe software for clinical note transcription with the patient, who gave verbal consent to proceed.  History of Present Illness Nathan [name] is a 48 year old male who presents for shoulder pain and weight management.  He experiences persistent pain in the entire right shoulder, particularly on the lateral aspect, following a fall through a deck on May 9th. An x-ray performed at an urgent care did not reveal any muscle tears, and he was not provided with any pain medication. He has been using gabapentin  for pain management for his back. The pain is exacerbated when lifting the arm.  He has a history of sleep apnea and obesity. His daughter mentioned Wegovy , a medication used for weight loss that has been approved for sleep apnea. Previous attempts to obtain insurance approval for Wegovy  and Saxenda  were denied. He is interested in retrying for approval, believing it may help with his sleep apnea by aiding weight loss. Weight today is 287 lbs with a BMI of 41.27, comorobidities include OSA, anxiety and gerd  He has a history of GERD, which is currently well-controlled. He also has hyperlipidemia, with the last recorded LDL level being 130 mg/dL. His ASCVD risk score is 6.9%.  He has a history of anxiety and is currently taking Lexapro  10 mg daily, which he takes every night before bed. His  mood and anxiety are stable with this medication.  He mentions having bone spurs on his spine, which cause significant back pain. He has not pursued further evaluation due to the cost of copayments for specialist visits.      The 10-year ASCVD risk score (Arnett DK, et al., 2019) is: 6.4%   Values used to calculate the score:     Age: 50 years     Sex: Male     Is Non-Hispanic African American: No     Diabetic: No     Tobacco smoker: No     Systolic Blood Pressure: 130 mmHg     Is BP treated: No     HDL Cholesterol: 28 mg/dL     Total Cholesterol: 197 mg/dL      04/03/8118    1:47 PM 11/10/2023    9:04 AM 10/13/2023   10:52 AM  Depression screen PHQ 2/9  Decreased Interest 0 0 0  Down, Depressed, Hopeless 0 0 0  PHQ - 2 Score 0 0 0  Altered sleeping 0 0 0  Tired, decreased energy 0 0 1  Change in appetite 0 0 0  Feeling bad or failure about yourself  0 0 0  Trouble concentrating 0 0 1  Moving slowly or fidgety/restless 0 0 1  Suicidal thoughts 0 0 0  PHQ-9 Score 0 0 3  Difficult doing work/chores Not difficult at all Not difficult at all Not difficult at all       05/17/2024    1:42 PM 11/10/2023    9:08 AM 10/13/2023   10:52 AM 03/04/2022    8:16 AM  GAD 7 : Generalized Anxiety Score  Nervous, Anxious, on Edge 0 0 1 0  Control/stop worrying 0 0 1 0  Worry too much - different things 0 0 1 0  Trouble relaxing 0 0 0 0  Restless 0 0 2 0  Easily annoyed or irritable 0 0 1 0  Afraid - awful might happen 0 0 1 0  Total GAD 7 Score 0 0 7 0  Anxiety Difficulty Not difficult at all Not difficult at all Somewhat difficult Not difficult at all     Relevant past medical, surgical, family and social history reviewed and updated as indicated. Interim medical history since our last visit reviewed. Allergies and medications reviewed and updated.  Review of Systems  Ten systems reviewed and is negative except as mentioned in HPI      Objective:      BP 130/78 (BP  Location: Right Arm, Patient Position: Sitting, Cuff Size: Normal)   Pulse 89   Temp 97.9 F (36.6 C) (Oral)   Ht 5\' 10"  (1.778 m)   Wt 287 lb 9.6 oz (130.5 kg)   SpO2 99%   BMI 41.27 kg/m    Wt Readings from Last 3 Encounters:  05/17/24 287 lb 9.6 oz (130.5 kg)  05/03/24 275 lb 5.7 oz (124.9 kg)  10/13/23 275 lb 6.4 oz (124.9 kg)    Physical Exam Vitals reviewed.  Constitutional:      Appearance: Normal appearance.  HENT:     Head: Normocephalic.  Cardiovascular:     Rate and Rhythm: Normal rate and regular rhythm.  Pulmonary:     Effort: Pulmonary effort is normal.     Breath sounds: Normal breath sounds.  Musculoskeletal:     Right shoulder: Tenderness present. Decreased range of motion.  Skin:    General: Skin is warm and dry.  Neurological:     General: No focal deficit present.     Mental Status: He is alert and oriented to person, place, and time. Mental status is at baseline.  Psychiatric:        Mood and Affect: Mood normal.        Behavior: Behavior normal.        Thought Content: Thought content normal.        Judgment: Judgment normal.        Results for orders placed or performed in visit on 10/13/23  CBC with Differential/Platelet   Collection Time: 10/13/23 11:14 AM  Result Value Ref Range   WBC 7.6 3.8 - 10.8 Thousand/uL   RBC 5.71 4.20 - 5.80 Million/uL   Hemoglobin 16.3 13.2 - 17.1 g/dL   HCT 28.4 13.2 - 44.0 %   MCV 84.9 80.0 - 100.0 fL   MCH 28.5 27.0 - 33.0 pg   MCHC 33.6 32.0 - 36.0 g/dL   RDW 10.2 72.5 - 36.6 %   Platelets 287 140 - 400 Thousand/uL   MPV 10.4 7.5 - 12.5 fL   Neutro Abs 5,138 1,500 - 7,800 cells/uL   Absolute Lymphocytes 1,634 850 - 3,900 cells/uL   Absolute Monocytes 570 200 - 950 cells/uL   Eosinophils Absolute 190 15 - 500 cells/uL  Basophils Absolute 68 0 - 200 cells/uL   Neutrophils Relative % 67.6 %   Total Lymphocyte 21.5 %   Monocytes Relative 7.5 %   Eosinophils Relative 2.5 %   Basophils Relative 0.9 %   COMPLETE METABOLIC PANEL WITH GFR   Collection Time: 10/13/23 11:14 AM  Result Value Ref Range   Glucose, Bld 87 65 - 99 mg/dL   BUN 18 7 - 25 mg/dL   Creat 5.78 4.69 - 6.29 mg/dL   eGFR 98 > OR = 60 BM/WUX/3.24M0   BUN/Creatinine Ratio SEE NOTE: 6 - 22 (calc)   Sodium 140 135 - 146 mmol/L   Potassium 5.1 3.5 - 5.3 mmol/L   Chloride 105 98 - 110 mmol/L   CO2 30 20 - 32 mmol/L   Calcium 9.5 8.6 - 10.3 mg/dL   Total Protein 6.7 6.1 - 8.1 g/dL   Albumin 4.5 3.6 - 5.1 g/dL   Globulin 2.2 1.9 - 3.7 g/dL (calc)   AG Ratio 2.0 1.0 - 2.5 (calc)   Total Bilirubin 0.5 0.2 - 1.2 mg/dL   Alkaline phosphatase (APISO) 71 36 - 130 U/L   AST 18 10 - 40 U/L   ALT 22 9 - 46 U/L  Lipid panel   Collection Time: 10/13/23 11:14 AM  Result Value Ref Range   Cholesterol 197 <200 mg/dL   HDL 28 (L) > OR = 40 mg/dL   Triglycerides 102 (H) <150 mg/dL   LDL Cholesterol (Calc) 130 (H) mg/dL (calc)   Total CHOL/HDL Ratio 7.0 (H) <5.0 (calc)   Non-HDL Cholesterol (Calc) 169 (H) <130 mg/dL (calc)  Hemoglobin V2Z   Collection Time: 10/13/23 11:14 AM  Result Value Ref Range   Hgb A1c MFr Bld 5.4 <5.7 % of total Hgb   Mean Plasma Glucose 108 mg/dL   eAG (mmol/L) 6.0 mmol/L  TSH   Collection Time: 10/13/23 11:14 AM  Result Value Ref Range   TSH 1.52 0.40 - 4.50 mIU/L  PSA   Collection Time: 10/13/23 11:14 AM  Result Value Ref Range   PSA 0.54 < OR = 4.00 ng/mL          Assessment & Plan:   Problem List Items Addressed This Visit       Respiratory   Obstructive sleep apnea on CPAP - Primary   Relevant Medications   WEGOVY  0.25 MG/0.5ML SOAJ     Digestive   GERD (gastroesophageal reflux disease)     Nervous and Auditory   Lumbar radiculopathy   Relevant Medications   gabapentin  (NEURONTIN ) 300 MG capsule   WEGOVY  0.25 MG/0.5ML SOAJ   escitalopram  (LEXAPRO ) 10 MG tablet     Other   Morbid obesity (HCC)   Relevant Medications   WEGOVY  0.25 MG/0.5ML SOAJ   Mixed hyperlipidemia    Anxiety   Relevant Medications   escitalopram  (LEXAPRO ) 10 MG tablet   Acute pain of right shoulder   Other Visit Diagnoses       Acute bilateral low back pain with left-sided sciatica       get lumbar xray, start naproxen , robaxin  and steroid taper, can do heat therapy, if no improvment will refer to ortho   Relevant Medications   gabapentin  (NEURONTIN ) 300 MG capsule   WEGOVY  0.25 MG/0.5ML SOAJ   naproxen  (NAPROSYN ) 500 MG tablet   escitalopram  (LEXAPRO ) 10 MG tablet     Pain of right hip       get lumbar xray, start naproxen , robaxin  and steroid taper, can  do heat therapy, if no improvment will refer to ortho   Relevant Medications   naproxen  (NAPROSYN ) 500 MG tablet     Screening for colon cancer       Relevant Orders   Ambulatory referral to Gastroenterology        Assessment and Plan Assessment & Plan Right shoulder pain Right shoulder pain following a fall on May 9th, with persistent pain during movement, especially when lifting the arm. Concern for possible rotator cuff injury. Previous x-ray showed no significant findings. Current pain management includes gabapentin  and Tylenol . - Prescribe naproxen  for pain management. - Refer to orthopedic specialist for further evaluation  to assess if rotator cuff injury.  Obesity Obesity with previous insurance denial for Wegovy . Discussed Wegovy 's potential benefits for weight loss and indirect improvement of sleep apnea. Insurance coverage remains a barrier. He is informed about administration and potential side effects. - Submit prior authorization for Wegovy  for weight loss.  - Encourage continuation of lifestyle modifications, including dietary management and regular exercise. -continue to increase physical activity, getting at least 150 min of physical activity a week.  Work on including Runner, broadcasting/film/video 2 days a week.  - continue eating at a calorie deficit 1800-2000 cal a day, eating a well balanced diet with whole foods,  avoiding processed foods.     Sleep apnea Sleep apnea management may benefit from weight reduction via Wegovy , pending insurance approval. - Submit prior authorization for Wegovy  for weight loss, potentially aiding sleep apnea management.  GERD GERD is well-controlled with no reported issues.  Anxiety Anxiety is managed with Lexapro  10 mg daily, with no reported issues. - Refill Lexapro  prescription.        Follow up plan: Return in about 4 weeks (around 06/14/2024) for follow up.

## 2024-05-17 ENCOUNTER — Other Ambulatory Visit: Payer: Self-pay | Admitting: Nurse Practitioner

## 2024-05-17 ENCOUNTER — Ambulatory Visit (INDEPENDENT_AMBULATORY_CARE_PROVIDER_SITE_OTHER): Admitting: Nurse Practitioner

## 2024-05-17 ENCOUNTER — Encounter: Payer: Self-pay | Admitting: Nurse Practitioner

## 2024-05-17 VITALS — BP 130/78 | HR 89 | Temp 97.9°F | Ht 70.0 in | Wt 287.6 lb

## 2024-05-17 DIAGNOSIS — M25551 Pain in right hip: Secondary | ICD-10-CM

## 2024-05-17 DIAGNOSIS — M5416 Radiculopathy, lumbar region: Secondary | ICD-10-CM

## 2024-05-17 DIAGNOSIS — M25511 Pain in right shoulder: Secondary | ICD-10-CM

## 2024-05-17 DIAGNOSIS — F419 Anxiety disorder, unspecified: Secondary | ICD-10-CM

## 2024-05-17 DIAGNOSIS — M5442 Lumbago with sciatica, left side: Secondary | ICD-10-CM

## 2024-05-17 DIAGNOSIS — E782 Mixed hyperlipidemia: Secondary | ICD-10-CM | POA: Diagnosis not present

## 2024-05-17 DIAGNOSIS — Z1211 Encounter for screening for malignant neoplasm of colon: Secondary | ICD-10-CM

## 2024-05-17 DIAGNOSIS — K219 Gastro-esophageal reflux disease without esophagitis: Secondary | ICD-10-CM

## 2024-05-17 DIAGNOSIS — G4733 Obstructive sleep apnea (adult) (pediatric): Secondary | ICD-10-CM | POA: Diagnosis not present

## 2024-05-17 MED ORDER — ESCITALOPRAM OXALATE 10 MG PO TABS: 10.0000 mg | ORAL_TABLET | Freq: Every day | ORAL | 1 refills | Status: AC

## 2024-05-17 MED ORDER — GABAPENTIN 300 MG PO CAPS
600.0000 mg | ORAL_CAPSULE | Freq: Three times a day (TID) | ORAL | 1 refills | Status: DC
Start: 2024-05-17 — End: 2024-07-22

## 2024-05-17 MED ORDER — WEGOVY 0.25 MG/0.5ML ~~LOC~~ SOAJ
0.2500 mg | SUBCUTANEOUS | 0 refills | Status: AC
Start: 2024-05-17 — End: ?

## 2024-05-17 MED ORDER — NAPROXEN 500 MG PO TABS: 500.0000 mg | ORAL_TABLET | Freq: Two times a day (BID) | ORAL | 0 refills | Status: DC

## 2024-05-21 NOTE — Telephone Encounter (Signed)
 Requested medications are due for refill today.  See note  Requested medications are on the active medications list.  yes  Last refill. 05/17/2024  Future visit scheduled.   no  Notes to clinic.    Pharmacy comment: Alternative Requested:DRUG NOT COVERED.      Requested Prescriptions  Pending Prescriptions Disp Refills   WEGOVY  0.25 MG/0.5ML SOAJ [Pharmacy Med Name: WEGOVY  0.25 MG/0.5 ML PEN]  0    Sig: Inject 0.25 mg into the skin once a week. Use this dose for 1 month (4 shots) and then increase to next higher dose.     Endocrinology:  Diabetes - GLP-1 Receptor Agonists - semaglutide  Failed - 05/21/2024  2:19 PM      Failed - HBA1C in normal range and within 180 days    Hgb A1c MFr Bld  Date Value Ref Range Status  10/13/2023 5.4 <5.7 % of total Hgb Final    Comment:    For the purpose of screening for the presence of diabetes: . <5.7%       Consistent with the absence of diabetes 5.7-6.4%    Consistent with increased risk for diabetes             (prediabetes) > or =6.5%  Consistent with diabetes . This assay result is consistent with a decreased risk of diabetes. . Currently, no consensus exists regarding use of hemoglobin A1c for diagnosis of diabetes in children. . According to American Diabetes Association (ADA) guidelines, hemoglobin A1c <7.0% represents optimal control in non-pregnant diabetic patients. Different metrics may apply to specific patient populations.  Standards of Medical Care in Diabetes(ADA). .          Passed - Cr in normal range and within 360 days    Creat  Date Value Ref Range Status  10/13/2023 0.95 0.60 - 1.29 mg/dL Final         Passed - Valid encounter within last 6 months    Recent Outpatient Visits           4 days ago Obstructive sleep apnea on CPAP   [facility] Nathan [name], [place]

## 2024-05-31 DIAGNOSIS — M25511 Pain in right shoulder: Secondary | ICD-10-CM | POA: Diagnosis not present

## 2024-06-24 ENCOUNTER — Other Ambulatory Visit: Payer: Self-pay | Admitting: Nurse Practitioner

## 2024-06-24 DIAGNOSIS — M5442 Lumbago with sciatica, left side: Secondary | ICD-10-CM

## 2024-06-24 DIAGNOSIS — M25551 Pain in right hip: Secondary | ICD-10-CM

## 2024-06-26 NOTE — Telephone Encounter (Signed)
 D/C 10/13/23. Requested Prescriptions  Refused Prescriptions Disp Refills   methocarbamol  (ROBAXIN ) 500 MG tablet [Pharmacy Med Name: METHOCARBAMOL  500 MG TABLET] 60 tablet 0    Sig: TAKE 1 TABLET (500 MG TOTAL) BY MOUTH 2 (TWO) TIMES DAILY AS NEEDED FOR MUSCLE SPASMS.     Not Delegated - Analgesics:  Muscle Relaxants Failed - 06/26/2024  1:47 PM      Failed - This refill cannot be delegated      Passed - Valid encounter within last 6 months    Recent Outpatient Visits           1 month ago Obstructive sleep apnea on CPAP   [facility] Gareth Mliss [place], [place]

## 2024-07-16 ENCOUNTER — Other Ambulatory Visit (HOSPITAL_COMMUNITY): Payer: Self-pay

## 2024-07-16 NOTE — Telephone Encounter (Signed)
 Weight Loss Drugs Not Covered, Plan Benefit Exclusion

## 2024-07-21 ENCOUNTER — Other Ambulatory Visit: Payer: Self-pay | Admitting: Nurse Practitioner

## 2024-07-21 DIAGNOSIS — M5416 Radiculopathy, lumbar region: Secondary | ICD-10-CM

## 2024-07-22 ENCOUNTER — Other Ambulatory Visit: Payer: Self-pay | Admitting: Nurse Practitioner

## 2024-07-22 DIAGNOSIS — M25551 Pain in right hip: Secondary | ICD-10-CM

## 2024-07-22 DIAGNOSIS — M5442 Lumbago with sciatica, left side: Secondary | ICD-10-CM

## 2024-07-22 NOTE — Telephone Encounter (Signed)
 Requested Prescriptions  Pending Prescriptions Disp Refills   gabapentin  (NEURONTIN ) 300 MG capsule [Pharmacy Med Name: GABAPENTIN  300 MG CAPSULE] 180 capsule 1    Sig: TAKE 2 CAPSULES BY MOUTH 3 TIMES DAILY.     Neurology: Anticonvulsants - gabapentin  Passed - 07/22/2024  5:26 PM      Passed - Cr in normal range and within 360 days    Creat  Date Value Ref Range Status  10/13/2023 0.95 0.60 - 1.29 mg/dL Final         Passed - Completed PHQ-2 or PHQ-9 in the last 360 days      Passed - Valid encounter within last 12 months    Recent Outpatient Visits           2 months ago Obstructive sleep apnea on CPAP   [facility] Gareth Mliss [place], [place]

## 2024-07-23 NOTE — Telephone Encounter (Signed)
 Requested Prescriptions  Pending Prescriptions Disp Refills   naproxen  (NAPROSYN ) 500 MG tablet [Pharmacy Med Name: NAPROXEN  500 MG TABLET] 60 tablet 0    Sig: TAKE 1 TABLET BY MOUTH 2 TIMES DAILY WITH A MEAL.     Analgesics:  NSAIDS Failed - 07/23/2024 11:36 AM      Failed - Manual Review: Labs are only required if the patient has taken medication for more than 8 weeks.      Passed - Cr in normal range and within 360 days    Creat  Date Value Ref Range Status  10/13/2023 0.95 0.60 - 1.29 mg/dL Final         Passed - HGB in normal range and within 360 days    Hemoglobin  Date Value Ref Range Status  10/13/2023 16.3 13.2 - 17.1 g/dL Final         Passed - PLT in normal range and within 360 days    Platelets  Date Value Ref Range Status  10/13/2023 287 140 - 400 Thousand/uL Final         Passed - HCT in normal range and within 360 days    HCT  Date Value Ref Range Status  10/13/2023 48.5 38.5 - 50.0 % Final         Passed - eGFR is 30 or above and within 360 days    GFR, Est African American  Date Value Ref Range Status  11/12/2020 115 > OR = 60 mL/min/1.5m2 Final   GFR, Est Non African American  Date Value Ref Range Status  11/12/2020 99 > OR = 60 mL/min/1.70m2 Final   eGFR  Date Value Ref Range Status  10/13/2023 98 > OR = 60 mL/min/1.55m2 Final         Passed - Patient is not pregnant      Passed - Valid encounter within last 12 months    Recent Outpatient Visits           2 months ago Obstructive sleep apnea on CPAP   [facility] Gareth Mliss [place], [place]

## 2024-08-23 ENCOUNTER — Other Ambulatory Visit: Payer: Self-pay | Admitting: Nurse Practitioner

## 2024-08-23 DIAGNOSIS — M25551 Pain in right hip: Secondary | ICD-10-CM

## 2024-08-23 DIAGNOSIS — M5442 Lumbago with sciatica, left side: Secondary | ICD-10-CM

## 2024-08-25 NOTE — Telephone Encounter (Signed)
 Requested Prescriptions  Pending Prescriptions Disp Refills   naproxen  (NAPROSYN ) 500 MG tablet [Pharmacy Med Name: NAPROXEN  500 MG TABLET] 180 tablet 0    Sig: TAKE 1 TABLET BY MOUTH 2 TIMES DAILY WITH A MEAL.     Analgesics:  NSAIDS Failed - 08/25/2024  8:52 AM      Failed - Manual Review: Labs are only required if the patient has taken medication for more than 8 weeks.      Passed - Cr in normal range and within 360 days    Creat  Date Value Ref Range Status  10/13/2023 0.95 0.60 - 1.29 mg/dL Final         Passed - HGB in normal range and within 360 days    Hemoglobin  Date Value Ref Range Status  10/13/2023 16.3 13.2 - 17.1 g/dL Final         Passed - PLT in normal range and within 360 days    Platelets  Date Value Ref Range Status  10/13/2023 287 140 - 400 Thousand/uL Final         Passed - HCT in normal range and within 360 days    HCT  Date Value Ref Range Status  10/13/2023 48.5 38.5 - 50.0 % Final         Passed - eGFR is 30 or above and within 360 days    GFR, Est African American  Date Value Ref Range Status  11/12/2020 115 > OR = 60 mL/min/1.47m2 Final   GFR, Est Non African American  Date Value Ref Range Status  11/12/2020 99 > OR = 60 mL/min/1.9m2 Final   eGFR  Date Value Ref Range Status  10/13/2023 98 > OR = 60 mL/min/1.79m2 Final         Passed - Patient is not pregnant      Passed - Valid encounter within last 12 months    Recent Outpatient Visits           3 months ago Obstructive sleep apnea on CPAP   [facility] Gareth Mliss [place], [place]

## 2024-10-04 ENCOUNTER — Telehealth: Payer: Self-pay | Admitting: Nurse Practitioner

## 2024-10-04 ENCOUNTER — Other Ambulatory Visit: Payer: Self-pay

## 2024-10-04 DIAGNOSIS — M5416 Radiculopathy, lumbar region: Secondary | ICD-10-CM

## 2024-10-04 DIAGNOSIS — M5442 Lumbago with sciatica, left side: Secondary | ICD-10-CM

## 2024-10-04 DIAGNOSIS — M25551 Pain in right hip: Secondary | ICD-10-CM

## 2024-10-04 NOTE — Telephone Encounter (Signed)
 Referral updated and sent.

## 2024-10-12 ENCOUNTER — Other Ambulatory Visit: Payer: Self-pay | Admitting: Nurse Practitioner

## 2024-10-12 DIAGNOSIS — M5416 Radiculopathy, lumbar region: Secondary | ICD-10-CM

## 2024-10-15 NOTE — Telephone Encounter (Signed)
 Requested medication (s) are due for refill today: yes  Requested medication (s) are on the active medication list: yes  Last refill:  07/22/24 #180 1 refills  Future visit scheduled: no   Notes to clinic:  protocol failed last labs 10/13/23 do you want to refill Rx?     Requested Prescriptions  Pending Prescriptions Disp Refills   gabapentin  (NEURONTIN ) 300 MG capsule [Pharmacy Med Name: GABAPENTIN  300 MG CAPSULE] 180 capsule 1    Sig: TAKE 2 CAPSULES BY MOUTH 3 TIMES DAILY.     Neurology: Anticonvulsants - gabapentin  Failed - 10/15/2024 10:05 AM      Failed - Cr in normal range and within 360 days    Creat  Date Value Ref Range Status  10/13/2023 0.95 0.60 - 1.29 mg/dL Final         Passed - Completed PHQ-2 or PHQ-9 in the last 360 days      Passed - Valid encounter within last 12 months    Recent Outpatient Visits           5 months ago Obstructive sleep apnea on CPAP   [facility] Gareth Mliss [place], [place]

## 2024-10-21 ENCOUNTER — Other Ambulatory Visit: Payer: Self-pay | Admitting: Nurse Practitioner

## 2024-10-21 DIAGNOSIS — M5416 Radiculopathy, lumbar region: Secondary | ICD-10-CM

## 2024-10-22 NOTE — Telephone Encounter (Signed)
 Requested Prescriptions  Pending Prescriptions Disp Refills   gabapentin  (NEURONTIN ) 300 MG capsule [Pharmacy Med Name: GABAPENTIN  300 MG CAPSULE] 540 capsule 1    Sig: TAKE 2 CAPSULES BY MOUTH 3 TIMES DAILY.     Neurology: Anticonvulsants - gabapentin  Failed - 10/22/2024  5:01 PM      Failed - Cr in normal range and within 360 days    Creat  Date Value Ref Range Status  10/13/2023 0.95 0.60 - 1.29 mg/dL Final         Passed - Completed PHQ-2 or PHQ-9 in the last 360 days      Passed - Valid encounter within last 12 months    Recent Outpatient Visits           5 months ago Obstructive sleep apnea on CPAP   [facility] Gareth Mliss [place], [place]

## 2024-10-28 ENCOUNTER — Encounter: Payer: Self-pay | Admitting: Radiology

## 2024-11-13 ENCOUNTER — Ambulatory Visit (INDEPENDENT_AMBULATORY_CARE_PROVIDER_SITE_OTHER)

## 2024-11-13 ENCOUNTER — Other Ambulatory Visit: Payer: Self-pay

## 2024-11-13 VITALS — BP 118/61 | HR 70 | Resp 16 | Ht 70.0 in | Wt 275.0 lb

## 2024-11-13 DIAGNOSIS — H66002 Acute suppurative otitis media without spontaneous rupture of ear drum, left ear: Secondary | ICD-10-CM

## 2024-11-13 MED ORDER — FLUTICASONE PROPIONATE 50 MCG/ACT NA SUSP: 2.0000 | Freq: Every day | NASAL | 6 refills | Status: AC

## 2024-11-13 MED ORDER — AMOXICILLIN-POT CLAVULANATE 875-125 MG PO TABS: 1.0000 | ORAL_TABLET | Freq: Two times a day (BID) | ORAL | 0 refills | Status: AC

## 2024-11-13 NOTE — Progress Notes (Signed)
 Acute visit   Patient: Nathan [name]   DOB: 23-Jan-1976   48 y.o. Male  MRN: [id number] PCP: Gareth Mliss [place], FNP   Chief Complaint  Patient presents with   Acute Visit    hard to hear out of left ear( 1 wk)   Subjective    Discussed the use of AI scribe software for clinical note transcription with the patient, who gave verbal consent to proceed.  History of Present Illness Nathan [name] is a 48 year old male with a history of frequent ear infections who presents with ear congestion and hearing difficulties.  He has been experiencing ear congestion and hearing difficulties for the past week to week and a half, with one ear being worse than the other. He describes the sensation as 'stopped up' with a 'humming' sound.  He has been taking Tylenol  for congestion, which has alleviated coughing but not the ear symptoms. No recent fever, chills, or significant sinus pain, although there is some sinus congestion. He has not started any new medications recently.  He has a history of allergies that have worsened with age. No recent changes in medication or known allergies to antibiotics. He recalls a similar episode a few years ago that was treated with antibiotics.  No fever, chills, significant sinus pain, or breathing difficulties. Reports sinus congestion and occasional ear popping.  Review of systems as noted in HPI.   Objective    BP 118/61 (BP Location: Left Arm, Patient Position: Sitting, Cuff Size: Large)   Pulse 70   Resp 16   Ht 5' 10 (1.778 m)   Wt 275 lb (124.7 kg)   SpO2 98%   BMI 39.46 kg/m  Physical Exam Constitutional:      Appearance: Normal appearance.  HENT:     Head: Normocephalic and atraumatic.     Right Ear: Tympanic membrane, ear canal and external ear normal.     Left Ear: Decreased hearing noted. A middle ear effusion is present. Tympanic membrane is erythematous and bulging. Tympanic membrane is not perforated.     Mouth/Throat:     Mouth: Mucous  membranes are moist.  Eyes:     Pupils: Pupils are equal, round, and reactive to light.  Pulmonary:     Effort: Pulmonary effort is normal.  Skin:    General: Skin is warm.  Neurological:     General: No focal deficit present.     Mental Status: He is alert.       No results found for any visits on 11/13/24.  Assessment & Plan     Problem List Items Addressed This Visit   None Visit Diagnoses       Acute suppurative otitis media of left ear without spontaneous rupture of tympanic membrane, recurrence not specified    -  Primary   Relevant Medications   amoxicillin -clavulanate (AUGMENTIN ) 875-125 MG tablet   fluticasone  (FLONASE ) 50 MCG/ACT nasal spray      Assessment & Plan Acute left otitis media Patient with L ear discomfort, congestion, and difficulty hearing for the past week. Exam notable for L TM erythematous and bulging. Has hx of recurrent ear infections. - Prescribed Augmentin  (amoxicillin /clavulanate) twice daily for 7 days. - Recommended Flonase  nasal spray, two sprays in each nostril daily. - Advised taking Augmentin  with food to minimize gastrointestinal side effects. - Instructed to return for re-evaluation if symptoms persist post-antibiotics. - Encouraged increased water  intake.   Meds ordered this encounter  Medications  amoxicillin -clavulanate (AUGMENTIN ) 875-125 MG tablet    Sig: Take 1 tablet by mouth 2 (two) times daily for 7 days.    Dispense:  14 tablet    Refill:  0   fluticasone  (FLONASE ) 50 MCG/ACT nasal spray    Sig: Place 2 sprays into both nostrils daily.    Dispense:  16 g    Refill:  6     No follow-ups on file.      Isaiah DELENA Pepper, MD  [facility] [phone] (phone) [phone] (fax)

## 2024-11-15 ENCOUNTER — Encounter: Payer: Self-pay | Admitting: Sports Medicine

## 2024-11-15 ENCOUNTER — Ambulatory Visit (INDEPENDENT_AMBULATORY_CARE_PROVIDER_SITE_OTHER): Admitting: Sports Medicine

## 2024-11-15 DIAGNOSIS — S4991XA Unspecified injury of right shoulder and upper arm, initial encounter: Secondary | ICD-10-CM | POA: Diagnosis not present

## 2024-11-15 DIAGNOSIS — M75101 Unspecified rotator cuff tear or rupture of right shoulder, not specified as traumatic: Secondary | ICD-10-CM

## 2024-11-15 DIAGNOSIS — M25511 Pain in right shoulder: Secondary | ICD-10-CM | POA: Diagnosis not present

## 2024-11-15 DIAGNOSIS — M12811 Other specific arthropathies, not elsewhere classified, right shoulder: Secondary | ICD-10-CM

## 2024-11-15 DIAGNOSIS — G8929 Other chronic pain: Secondary | ICD-10-CM

## 2024-11-15 MED ORDER — DICLOFENAC SODIUM 75 MG PO TBEC: 75.0000 mg | DELAYED_RELEASE_TABLET | Freq: Two times a day (BID) | ORAL | 1 refills | Status: DC

## 2024-11-15 NOTE — Progress Notes (Signed)
 Patient says that he fell through a deck on 05/03/2024. He remembers landing on his elbow, and felt that his whole arm and shoulder went up, and he felt numb temporarily. He does not remember feeling a pop at that time. He was seen at an Urgent Care where he had x-rays, and followed up with [facility] after that. He is still having the same pain, and is not getting any relief with topical treatments. He has pain with all shoulder ROM, and is unable to sleep on that side. Patient points over the anterior shoulder and lateral deltoid when describing his pain. He denies any popping in the time since then, and denies any numbness or tingling down the arm. He has not done anything else to treat the shoulder pain in the meantime.

## 2024-11-15 NOTE — Progress Notes (Addendum)
 Lamar ONEIDA Reasoner - 48 y.o. male MRN [id number]  Date of birth: 1976/07/13  Office Visit Note: Visit Date: 11/15/2024 PCP: Gareth Mliss [place], FNP Referred by: Gareth Mliss [place], FNP  Subjective: Chief Complaint  Patient presents with   Right Shoulder - Pain   HPI: CHUCKY HOMES is a pleasant 48 y.o. male who presents today for acute on chronic right shoulder pain.  He is right-hand dominant.  Patient fell through a deck back in May when he landed on his elbow and his entire arm/shoulder was forced upward.  He felt immediate pain and numbness temporarily.  He was seen at an urgent care with x-rays which were nonacute for fracture.  He had been following with an orthopedic provider at [facility].  He instructed for some home exercises and plan for MRI.  *Independent note reviewed from 05/31/2024 -there was concern for rotator cuff tearing.  The plan was to move forward with MRI to further evaluate.  Per patient, this was lost in translation and he has not had any further testing.  Pertinent ROS were reviewed with the patient and found to be negative unless otherwise specified above in HPI.   Assessment & Plan: Visit Diagnoses:  1. Chronic right shoulder pain   2. Right shoulder injury, initial encounter   3. Rotator cuff tear arthropathy of right shoulder    Plan: Impression is chronic and persistent right shoulder pain after a medic injury falling through a deck back in May 2025.  He does have significant limitation in pain with active range of motion.  His exam suggest likely rotator cuff tearing/involvement.  His pain has persisted despite oral medications, time, home-based PT. Given this, I feel it is pertinent to forward with MRI of the right shoulder to evaluate the rotator cuff and intra-articular pathology.  I also would like to have him start on physical therapy to work more on range of motion to prevent stiffness and adhesive capsulitis as well as mild activation of the rotator cuff.   He prefers home therapy.  I did print out a customized handout and did review these exercises with him in the room today.  Will perform once daily.  I will see him back in 1 week after MRI to review and discuss next steps.  For his current pain, we will start him on oral diclofenac  75 mg once daily consistently, may take PM dosing as needed for breakthrough pain.  Follow-up: Return for F/u 1 week after MRI to review and discuss next steps.   Meds & Orders:  Meds ordered this encounter  Medications   diclofenac  (VOLTAREN ) 75 MG EC tablet    Sig: Take 1 tablet (75 mg total) by mouth 2 (two) times daily.    Dispense:  45 tablet    Refill:  1    Orders Placed This Encounter  Procedures   MR SHOULDER RIGHT WO CONTRAST     Procedures: No procedures performed      Clinical History: No specialty comments available.  He reports that he quit smoking about 17 years ago. His smoking use included cigarettes. He started smoking about 33 years ago. He has a 16 pack-year smoking history. His smokeless tobacco use includes snuff and chew. No results for input(s): HGBA1C, LABURIC in the last 8760 hours.  Objective:    Physical Exam  Gen: Well-appearing, in no acute distress; non-toxic CV: Well-perfused. Warm.  Resp: Breathing unlabored on room air; no wheezing. Psych: Fluid speech in conversation; appropriate  affect; normal thought process  Ortho Exam - Right shoulder: There is a mild degree of soft tissue swelling versus subacromial joint bursitis present.  No warmth, redness or abnormality of the skin.  There is significant limitation with active flexion and abduction past 100 degrees.  Positive drop arm test, there is pain with empty can testing with mild weakness.  There is pain with resisted external rotation and associated pain, preserved internal rotation.  Positive Vonzell Berber impingement testing.  Imaging:  *Independent review and interpretation of right shoulder x-ray from  05/03/2024 was performed by myself today.  Grashey, scapular Y and axial view were reviewed.  There is no acute fracture noted, humeral head is well located within the glenohumeral joint.  There is a mild degree of sclerosis just superior to the greater tuberosity, possibly indicative of impingement.  Narrative & Impression  CLINICAL DATA:  Right shoulder pain after fall   EXAM: RIGHT SHOULDER - 3 VIEW   COMPARISON:  None Available.   FINDINGS: There is no evidence of fracture or dislocation. There is no evidence of arthropathy or other focal bone abnormality. Soft tissues are unremarkable.   IMPRESSION: No acute osseous abnormality     Electronically Signed   By: Ranell Bring M.D.   On: 05/03/2024 15:43      Past Medical/Family/Surgical/Social History: Medications & Allergies reviewed per EMR, new medications updated. Patient Active Problem List   Diagnosis Date Noted   Acute pain of right shoulder 05/03/2024   Fall 05/03/2024   Abrasions of multiple sites 05/03/2024   Shoulder strain, right, initial encounter 05/03/2024   Anxiety 10/13/2023   Mixed hyperlipidemia 06/03/2022   Lumbar radiculopathy 06/03/2022   Bulging lumbar disc 07/29/2021   History of colonic polyps    Elevated BP without diagnosis of hypertension 10/28/2020   Vitamin D  deficiency 01/15/2019   Bone spur of foot 03/16/2018   Hemorrhoid 03/16/2018   Family history of colon cancer    GERD (gastroesophageal reflux disease) 09/05/2017   Bilateral elbow joint pain 11/23/2016   Obstructive sleep apnea on CPAP 11/12/2015   Morbid obesity (HCC) 11/12/2015   Syncope and collapse 09/24/2015   Snoring 09/24/2015   Abnormal EKG 09/24/2015   Past Medical History:  Diagnosis Date   GERD (gastroesophageal reflux disease)    OSA (obstructive sleep apnea)    Family History  Problem Relation Age of Onset   Cancer Mother    Stroke Father    Heart disease Father    Diabetes Father    Past Surgical History:   Procedure Laterality Date   COLONOSCOPY WITH PROPOFOL  N/A 10/20/2017   Procedure: COLONOSCOPY WITH PROPOFOL ;  Surgeon: Jinny Carmine, MD;  Location: [facility];  Service: Endoscopy;  Laterality: N/A;   COLONOSCOPY WITH PROPOFOL  N/A 11/16/2020   Procedure: COLONOSCOPY WITH BIOPSY;  Surgeon: Jinny Carmine, MD;  Location: [facility];  Service: Endoscopy;  Laterality: N/A;  Sleep apnea priority 4   ESOPHAGOGASTRODUODENOSCOPY (EGD) WITH PROPOFOL  N/A 10/20/2017   Procedure: ESOPHAGOGASTRODUODENOSCOPY (EGD) WITH PROPOFOL ;  Surgeon: Jinny Carmine, MD;  Location: [facility];  Service: Endoscopy;  Laterality: N/A;  sleep apnea   GALLBLADDER SURGERY     POLYPECTOMY N/A 11/16/2020   Procedure: POLYPECTOMY;  Surgeon: Jinny Carmine, MD;  Location: [facility];  Service: Endoscopy;  Laterality: N/A;   Social History   Occupational History   Not on file  Tobacco Use   Smoking status: Former    Current packs/day: 0.00    Average  packs/day: 1 pack/day for 16.0 years (16.0 ttl pk-yrs)    Types: Cigarettes    Start date: 75    Quit date: 2008    Years since quitting: 17.9   Smokeless tobacco: Current    Types: Snuff, Chew  Vaping Use   Vaping status: Never Used  Substance and Sexual Activity   Alcohol use: Yes    Comment: rare   Drug use: No   Sexual activity: Not Currently   Patient was instructed in 10 minutes of therapeutic exercises for right shoulder to improve strength, ROM and function according to my instructions and plan of care during the office visit. A customized handout was provided and demonstration of proper technique shown and discussed. Patient did perform exercises and demonstrate understanding through teachback.  All questions discussed and answered.  Lonell Sprang, DO Primary Care Sports Medicine Physician  [facility]  This note was dictated using Dragon naturally speaking software and may contain errors in syntax,  spelling, or content which have not been identified prior to signing this note.

## 2024-11-25 ENCOUNTER — Encounter: Payer: Self-pay | Admitting: Sports Medicine

## 2024-11-27 ENCOUNTER — Other Ambulatory Visit

## 2024-11-29 ENCOUNTER — Other Ambulatory Visit

## 2024-11-29 ENCOUNTER — Encounter: Payer: Self-pay | Admitting: Nurse Practitioner

## 2024-11-29 ENCOUNTER — Ambulatory Visit
Admission: RE | Admit: 2024-11-29 | Discharge: 2024-11-29 | Disposition: A | Discharge status: Home / Self Care | Source: Ambulatory Visit | Attending: Sports Medicine | Admitting: Sports Medicine

## 2024-11-29 DIAGNOSIS — G8929 Other chronic pain: Secondary | ICD-10-CM

## 2024-11-29 DIAGNOSIS — S46011A Strain of muscle(s) and tendon(s) of the rotator cuff of right shoulder, initial encounter: Secondary | ICD-10-CM | POA: Diagnosis not present

## 2024-11-29 DIAGNOSIS — S4991XA Unspecified injury of right shoulder and upper arm, initial encounter: Secondary | ICD-10-CM

## 2024-11-29 DIAGNOSIS — M12811 Other specific arthropathies, not elsewhere classified, right shoulder: Secondary | ICD-10-CM

## 2024-12-03 ENCOUNTER — Ambulatory Visit: Payer: Self-pay | Admitting: Sports Medicine

## 2024-12-06 ENCOUNTER — Ambulatory Visit: Admitting: Sports Medicine

## 2024-12-06 ENCOUNTER — Encounter: Payer: Self-pay | Admitting: Sports Medicine

## 2024-12-06 DIAGNOSIS — M25511 Pain in right shoulder: Secondary | ICD-10-CM

## 2024-12-06 DIAGNOSIS — G8929 Other chronic pain: Secondary | ICD-10-CM

## 2024-12-06 DIAGNOSIS — M12811 Other specific arthropathies, not elsewhere classified, right shoulder: Secondary | ICD-10-CM

## 2024-12-06 DIAGNOSIS — M75101 Unspecified rotator cuff tear or rupture of right shoulder, not specified as traumatic: Secondary | ICD-10-CM | POA: Diagnosis not present

## 2024-12-06 DIAGNOSIS — S46811A Strain of other muscles, fascia and tendons at shoulder and upper arm level, right arm, initial encounter: Secondary | ICD-10-CM | POA: Diagnosis not present

## 2024-12-06 DIAGNOSIS — S43003A Unspecified subluxation of unspecified shoulder joint, initial encounter: Secondary | ICD-10-CM

## 2024-12-06 NOTE — Progress Notes (Signed)
 Nathan [name] - 48 y.o. male MRN [id number]  Date of birth: 28-Feb-1976  Office Visit Note: Visit Date: 12/06/2024 PCP: Gareth Mliss FALCON, FNP Referred by: Gareth Mliss FALCON, FNP  Subjective: Chief Complaint  Patient presents with   Right Shoulder - Follow-up   HPI: Nathan [name] is a pleasant 48 y.o. male who presents today for follow-up of chronic right shoulder pain, previous MRI reviewed.  As reminder, Rob fell through a deck back in May when he landed on his elbow and his entire arm/shoulder was forced upward. He felt immediate pain and numbness temporarily. He was seen at an urgent care with x-rays which were nonacute for fracture.   He has been trying to do his physical therapy at home but working more so on range of motion to prevent stiffness.  This does cause him pain.  He continues with rather significant pain that is limiting his activities of daily living.  He did have an MRI last visit and is here to review this further today.  He is using oral Voltaren  75 mg twice daily which helps take the edge off slightly but still has rather notable pain.  Pertinent ROS were reviewed with the patient and found to be negative unless otherwise specified above in HPI.   Assessment & Plan: Visit Diagnoses:  1. Chronic right shoulder pain   2. Rotator cuff tear arthropathy of right shoulder   3. Traumatic rupture of supraspinatus tendon of right shoulder, initial encounter   4. Subluxation of tendon of long head of biceps    Plan: Impression is chronic and ongoing right shoulder pain after injury back in May with multiple pathologies.  MRI was reviewed today which shows rotator cuff tendinosis with superimposed full-thickness supraspinatus tearing as well as severe biceps tendinosis with interstitial tearing and evidence of subluxation.  There is notable tearing of the subscapularis as well, although on physical exam this is not quite as bothersome as his supraspinatus with associated weakness and  pain within the biceps tendon.  I did review the MRI and discussed the nature of his injuries.  I do think he would benefit from surgical evaluation specifically for the supraspinatus and his biceps tendon which may require tenodesis and/or tenotomy given the severe tendinosis and his degree of subluxation.  With the nature of his global superimposed rotator cuff tendinosis, I am not sure which of his tears would be able to be fixed surgically.  I would like him to see my orthopedic colleague, Dr. Addie to review this further and discuss possible surgical options.  Given that I do think surgery would give him the best chance at recovery, we will hold on any type of corticosteroid injection at this time but could consider this with Dr. Addie if surgical intervention not pursued.  He will continue oral diclofenac  75 mg twice daily in the interim.   Follow-up: Return for Make appt with Dr. Addie to discuss surgery for RC-tearing.   Meds & Orders: No orders of the defined types were placed in this encounter.  No orders of the defined types were placed in this encounter.    Procedures: No procedures performed      Clinical History: No specialty comments available.  He reports that he quit smoking about 17 years ago. His smoking use included cigarettes. He started smoking about 33 years ago. He has a 16 pack-year smoking history. His smokeless tobacco use includes snuff and chew. No results for input(s): HGBA1C, LABURIC in the last 8760  hours.  Objective:    Physical Exam  Gen: Well-appearing, in no acute distress; non-toxic CV: Well-perfused. Warm.  Resp: Breathing unlabored on room air; no wheezing. Psych: Fluid speech in conversation; appropriate affect; normal thought process  Ortho Exam - Right shoulder: Trace swelling present.  There is tenderness at Codman's point and within the bicipital groove proximally.  There is significant limitation with active flexion past 100 degrees, and no  ability to perform abduction typically past 95 degrees.  Positive drop arm test, positive empty can test, + speeds test with mild pain and subluxation with Yergason's testing at end range.  Imaging:  *I did independently review the MRI myself as well as with the patient in clinic today.  MR SHOULDER RIGHT WO CONTRAST CLINICAL DATA:  Chronic shoulder pain  EXAM: MRI OF THE RIGHT SHOULDER WITHOUT CONTRAST  TECHNIQUE: Multiplanar, multisequence MR imaging of the shoulder was performed. No intravenous contrast was administered.  COMPARISON:  None Available.  FINDINGS: Rotator cuff: Severe supraspinatus tendinosis with a full-thickness tear along the anterior insertion measuring 10 mm AP and 12 mm medial-lateral. Moderate-severe infraspinatus tendinosis with a small low-grade interstitial tear. Teres minor tendon is intact. Complete tear of the subscapularis with 2 cm of retraction.  Muscles: No muscle atrophy or edema. No intramuscular fluid collection or hematoma.  Biceps Long Head: Severe tendinosis of the intra-articular portion of the long head of the biceps tendon and medial subluxation from the proximal bicipital groove.  Acromioclavicular Joint: Moderate arthropathy of the acromioclavicular joint. No subacromial/subdeltoid bursal fluid.  Glenohumeral Joint: Small joint effusion.  No chondral defect.  Labrum: Grossly intact, but evaluation is limited by lack of intraarticular fluid/contrast.  Bones: No fracture or dislocation. No aggressive osseous lesion.  Other: No fluid collection or hematoma.  IMPRESSION: 1. Severe supraspinatus tendinosis with a full-thickness tear along the anterior insertion measuring 10 mm AP and 12 mm medial-lateral. 2. Moderate-severe infraspinatus tendinosis with a small low-grade interstitial tear. 3. Complete tear of the subscapularis with 2 cm of retraction. 4. Severe tendinosis of the intra-articular portion of the long head of the  biceps tendon and medial subluxation from the proximal bicipital groove.  Electronically Signed   By: Julaine Blanch M.D.   On: 12/01/2024 08:47  Past Medical/Family/Surgical/Social History: Medications & Allergies reviewed per EMR, new medications updated. Patient Active Problem List   Diagnosis Date Noted   Acute pain of right shoulder 05/03/2024   Fall 05/03/2024   Abrasions of multiple sites 05/03/2024   Shoulder strain, right, initial encounter 05/03/2024   Anxiety 10/13/2023   Mixed hyperlipidemia 06/03/2022   Lumbar radiculopathy 06/03/2022   Bulging lumbar disc 07/29/2021   History of colonic polyps    Elevated BP without diagnosis of hypertension 10/28/2020   Vitamin D  deficiency 01/15/2019   Bone spur of foot 03/16/2018   Hemorrhoid 03/16/2018   Family history of colon cancer    GERD (gastroesophageal reflux disease) 09/05/2017   Bilateral elbow joint pain 11/23/2016   Obstructive sleep apnea on CPAP 11/12/2015   Morbid obesity (HCC) 11/12/2015   Syncope and collapse 09/24/2015   Snoring 09/24/2015   Abnormal EKG 09/24/2015   Past Medical History:  Diagnosis Date   GERD (gastroesophageal reflux disease)    OSA (obstructive sleep apnea)    Family History  Problem Relation Age of Onset   Cancer Mother    Stroke Father    Heart disease Father    Diabetes Father    Past Surgical History:  Procedure  Laterality Date   COLONOSCOPY WITH PROPOFOL  N/A 10/20/2017   Procedure: COLONOSCOPY WITH PROPOFOL ;  Surgeon: Jinny Carmine, MD;  Location[facility];  Service: Endoscopy;  Laterality: N/A;   COLONOSCOPY WITH PROPOFOL  N/A 11/16/2020   Procedure: COLONOSCOPY WITH BIOPSY;  Surgeon: Jinny Carmine, MD;  Location: [facility];  Service: Endoscopy;  Laterality: N/A;  Sleep apnea priority 4   ESOPHAGOGASTRODUODENOSCOPY (EGD) WITH PROPOFOL  N/A 10/20/2017   Procedure: ESOPHAGOGASTRODUODENOSCOPY (EGD) WITH PROPOFOL ;  Surgeon: Jinny Carmine, MD;  Location: [facility];  Service: Endoscopy;  Laterality: N/A;  sleep apnea   GALLBLADDER SURGERY     POLYPECTOMY N/A 11/16/2020   Procedure: POLYPECTOMY;  Surgeon: Jinny Carmine, MD;  Location: [facility];  Service: Endoscopy;  Laterality: N/A;   Social History   Occupational History   Not on file  Tobacco Use   Smoking status: Former    Current packs/day: 0.00    Average packs/day: 1 pack/day for 16.0 years (16.0 ttl pk-yrs)    Types: Cigarettes    Start date: 71    Quit date: 2008    Years since quitting: 17.9   Smokeless tobacco: Current    Types: Snuff, Chew  Vaping Use   Vaping status: Never Used  Substance and Sexual Activity   Alcohol use: Yes    Comment: rare   Drug use: No   Sexual activity: Not Currently

## 2024-12-10 ENCOUNTER — Other Ambulatory Visit: Payer: Self-pay | Admitting: Nurse Practitioner

## 2024-12-10 DIAGNOSIS — M5442 Lumbago with sciatica, left side: Secondary | ICD-10-CM

## 2024-12-10 DIAGNOSIS — M25551 Pain in right hip: Secondary | ICD-10-CM

## 2024-12-13 ENCOUNTER — Ambulatory Visit: Admitting: Sports Medicine

## 2024-12-13 ENCOUNTER — Ambulatory Visit: Admitting: Orthopedic Surgery

## 2024-12-13 DIAGNOSIS — M75101 Unspecified rotator cuff tear or rupture of right shoulder, not specified as traumatic: Secondary | ICD-10-CM

## 2024-12-13 DIAGNOSIS — M12811 Other specific arthropathies, not elsewhere classified, right shoulder: Secondary | ICD-10-CM | POA: Diagnosis not present

## 2024-12-13 DIAGNOSIS — S46811A Strain of other muscles, fascia and tendons at shoulder and upper arm level, right arm, initial encounter: Secondary | ICD-10-CM

## 2024-12-13 NOTE — Telephone Encounter (Signed)
 Discontinued 11/15/24.  Requested Prescriptions  Pending Prescriptions Disp Refills   naproxen  (NAPROSYN ) 500 MG tablet [Pharmacy Med Name: NAPROXEN  500 MG TABLET] 180 tablet 0    Sig: TAKE 1 TABLET BY MOUTH TWICE A DAY WITH FOOD     Analgesics:  NSAIDS Failed - 12/13/2024  9:49 AM      Failed - Manual Review: Labs are only required if the patient has taken medication for more than 8 weeks.      Failed - Cr in normal range and within 360 days    Creat  Date Value Ref Range Status  10/13/2023 0.95 0.60 - 1.29 mg/dL Final         Failed - HGB in normal range and within 360 days    Hemoglobin  Date Value Ref Range Status  10/13/2023 16.3 13.2 - 17.1 g/dL Final         Failed - PLT in normal range and within 360 days    Platelets  Date Value Ref Range Status  10/13/2023 287 140 - 400 Thousand/uL Final         Failed - HCT in normal range and within 360 days    HCT  Date Value Ref Range Status  10/13/2023 48.5 38.5 - 50.0 % Final         Failed - eGFR is 30 or above and within 360 days    GFR, Est African American  Date Value Ref Range Status  11/12/2020 115 > OR = 60 mL/min/1.65m2 Final   GFR, Est Non African American  Date Value Ref Range Status  11/12/2020 99 > OR = 60 mL/min/1.17m2 Final   eGFR  Date Value Ref Range Status  10/13/2023 98 > OR = 60 mL/min/1.71m2 Final         Passed - Patient is not pregnant      Passed - Valid encounter within last 12 months    Recent Outpatient Visits           1 month ago Acute suppurative otitis media of left ear without spontaneous rupture of tympanic membrane, recurrence not specified   [facility] Franchot Isaiah LABOR, MD   7 months ago Obstructive sleep apnea on CPAP   [facility] Gareth Mliss FALCON, [place]

## 2024-12-14 ENCOUNTER — Encounter: Payer: Self-pay | Admitting: Orthopedic Surgery

## 2024-12-14 NOTE — Progress Notes (Signed)
 "  Office Visit Note   Patient: Nathan [name]           Date of Birth: 1976/07/11           MRN: [id number] Visit Date: 12/13/2024 Requested by: Gareth Mliss FALCON, FNP [address] [place],  [place] [address] PCP: Gareth Mliss FALCON, FNP  Subjective: Chief Complaint  Patient presents with   Right Shoulder - Pain    HPI: Nathan [name] is a 48 y.o. male who presents to the office reporting right shoulder pain.  Patient works as an personnel officer.  Right-hand-dominant.  Shoulder pain wakes the patient from sleep at night.  Patient states he fell through a deck in May.  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  No history of diabetes or heart disease.  Wife has MS.  MRI scan is reviewed.  Does show 1 cm tear of the supraspinatus but he also has a subscapularis tear.  Retraction there is about 2 cm.  Medial subluxation of the biceps is present which is symptomatic for this patient.  There is mild to moderate atrophy of the supraspinatus and mild atrophy of the subscap..                ROS: All systems reviewed are negative as they relate to the chief complaint within the history of present illness.  Patient denies fevers or chills.  Assessment & Plan: Visit Diagnoses:  1. Rotator cuff tear arthropathy of right shoulder   2. Traumatic rupture of supraspinatus tendon of right shoulder, initial encounter     Plan: Impression is right shoulder pain with increased external rotation on the right consistent with subscap tear.  Biceps tendon subluxation is likely the most symptomatic pain generator in the shoulder at this time.  Plan is arthroscopy and debridement with open rotator cuff tear repair of the subscap biceps tenodesis and supraspinatus tendon repair.  May have to augment the subscap repair with patch depending on tension on the repair.  Mobilization will be required.  Repairing the supraspinatus in a watertight fashion will go a long way  towards improving the shoulder function along with biceps tenodesis.  The risk and benefits are discussed with Lamar including not limited to infection or vessel damage incomplete pain relief along with incomplete restoration of function.  The extensive nature of the rehabilitative process is discussed.  Depending on the tension on the subscap repair we may be able to get him out of the sling by 4 weeks.  All questions answered.  Also depending on the subscap repair tension we may use CPM machine.  Follow-Up Instructions: No follow-ups on file.   Orders:  No orders of the defined types were placed in this encounter.  No orders of the defined types were placed in this encounter.     Procedures: No procedures performed   Clinical Data: No additional findings.  Objective: Vital Signs: There were no vitals taken for this visit.  Physical Exam:  Constitutional: Patient appears well-developed HEENT:  Head: Normocephalic Eyes:EOM are normal Neck: Normal range of motion Cardiovascular: Normal rate Pulmonary/chest: Effort normal Neurologic: Patient is alert Skin: Skin is warm Psychiatric: Patient has normal mood and affect  Ortho Exam: Ortho exam demonstrates passive external rotation on the right of 75 on the left 55 subscap strength 5- out of 5 on the right 5+ out of 5 on the left.  Does have pretty reasonable passive range of motion with bicipital groove  tenderness on the right negative on the left.  No AC joint tenderness on the right compared to the left.  Cervical spine range of motion is full.  There is some coarseness and popping with passive range of motion on that right-hand side at 90 degrees of abduction.  Specialty Comments:  No specialty comments available.  Imaging: No results found.   PMFS History: Patient Active Problem List   Diagnosis Date Noted   Acute pain of right shoulder 05/03/2024   Fall 05/03/2024   Abrasions of multiple sites 05/03/2024   Shoulder  strain, right, initial encounter 05/03/2024   Anxiety 10/13/2023   Mixed hyperlipidemia 06/03/2022   Lumbar radiculopathy 06/03/2022   Bulging lumbar disc 07/29/2021   History of colonic polyps    Elevated BP without diagnosis of hypertension 10/28/2020   Vitamin D  deficiency 01/15/2019   Bone spur of foot 03/16/2018   Hemorrhoid 03/16/2018   Family history of colon cancer    GERD (gastroesophageal reflux disease) 09/05/2017   Bilateral elbow joint pain 11/23/2016   Obstructive sleep apnea on CPAP 11/12/2015   Morbid obesity (HCC) 11/12/2015   Syncope and collapse 09/24/2015   Snoring 09/24/2015   Abnormal EKG 09/24/2015   Past Medical History:  Diagnosis Date   GERD (gastroesophageal reflux disease)    OSA (obstructive sleep apnea)     Family History  Problem Relation Age of Onset   Cancer Mother    Stroke Father    Heart disease Father    Diabetes Father     Past Surgical History:  Procedure Laterality Date   COLONOSCOPY WITH PROPOFOL  N/A 10/20/2017   Procedure: COLONOSCOPY WITH PROPOFOL ;  Surgeon: Jinny Carmine, MD;  Location: [facility];  Service: Endoscopy;  Laterality: N/A;   COLONOSCOPY WITH PROPOFOL  N/A 11/16/2020   Procedure: COLONOSCOPY WITH BIOPSY;  Surgeon: Jinny Carmine, MD;  Location: [facility];  Service: Endoscopy;  Laterality: N/A;  Sleep apnea priority 4   ESOPHAGOGASTRODUODENOSCOPY (EGD) WITH PROPOFOL  N/A 10/20/2017   Procedure: ESOPHAGOGASTRODUODENOSCOPY (EGD) WITH PROPOFOL ;  Surgeon: Jinny Carmine, MD;  Location: [facility];  Service: Endoscopy;  Laterality: N/A;  sleep apnea   GALLBLADDER SURGERY     POLYPECTOMY N/A 11/16/2020   Procedure: POLYPECTOMY;  Surgeon: Jinny Carmine, MD;  Location: [facility];  Service: Endoscopy;  Laterality: N/A;   Social History   Occupational History   Not on file  Tobacco Use   Smoking status: Former    Current packs/day: 0.00    Average packs/day: 1 pack/day for 16.0 years (16.0  ttl pk-yrs)    Types: Cigarettes    Start date: 41    Quit date: 2008    Years since quitting: 17.9   Smokeless tobacco: Current    Types: Snuff, Chew  Vaping Use   Vaping status: Never Used  Substance and Sexual Activity   Alcohol use: Yes    Comment: rare   Drug use: No   Sexual activity: Not Currently        "

## 2024-12-31 ENCOUNTER — Encounter: Payer: Self-pay | Admitting: Nurse Practitioner

## 2024-12-31 ENCOUNTER — Encounter: Payer: Self-pay | Admitting: Orthopedic Surgery

## 2024-12-31 NOTE — Telephone Encounter (Signed)
I sent him a note.

## 2025-01-02 ENCOUNTER — Encounter: Payer: Self-pay | Admitting: Orthopedic Surgery

## 2025-01-02 ENCOUNTER — Telehealth: Payer: Self-pay | Admitting: Orthopedic Surgery

## 2025-01-02 NOTE — Telephone Encounter (Signed)
 Pt called wanting to know if he got an injection before his surgery if that would help until he gets things straightened ou. He says he has a pre op apt tomorrow and wants to know if he can cancel that as well. I advised him that we don't have anything to do with pre op apts. Call back number is [phone].

## 2025-01-02 NOTE — Progress Notes (Signed)
 Surgical Instructions   Your procedure is scheduled on Tuesday, January 13th, 2026. Report to [facility] Main Entrance A at 1:15 P.M., then check in with the Admitting office. Any questions or running late day of surgery: call [phone]  Questions prior to your surgery date: call [phone], Monday-Friday, 8am-4pm. If you experience any cold or flu symptoms such as cough, fever, chills, shortness of breath, etc. between now and your scheduled surgery, please notify us  at the above number.     Remember:  Do not eat after midnight the night before your surgery   You may drink clear liquids until 12:15 PM the day of your surgery.   Clear liquids allowed are: Water , Non-Citrus Juices (without pulp), Carbonated Beverages, Clear Tea (no milk, honey, etc.), Black Coffee Only (NO MILK, CREAM OR POWDERED CREAMER of any kind), and Gatorade.    Take these medicines the morning of surgery with A SIP OF WATER : Gabapentin  (Neurontin ) Omeprazole  (Prilosec)   May take these medicines IF NEEDED: None    One week prior to surgery, STOP taking any Aspirin (unless otherwise instructed by your surgeon) Aleve , Naproxen , Ibuprofen, Motrin, Advil, Goody's, BC's, all herbal medications, fish oil, and non-prescription vitamins.  This includes your Naproxen  (Naprosyn ).                     Do NOT Smoke (Tobacco/Vaping) for 24 hours prior to your procedure.  If you use a CPAP at night, you may bring your mask/headgear for your overnight stay.   You will be asked to remove any contacts, glasses, piercing's, hearing aid's, dentures/partials prior to surgery. Please bring cases for these items if needed.    Patients discharged the day of surgery will not be allowed to drive home, and someone needs to stay with them for 24 hours.  SURGICAL WAITING ROOM VISITATION Patients may have no more than 2 support people in the waiting area - these visitors may rotate.   Pre-op nurse will coordinate an  appropriate time for 2 ADULT support persons, who may not rotate, to accompany patient in pre-op.  Children under the age of 107 must have an adult with them who is not the patient and must remain in the main waiting area with an adult.  If the patient needs to stay at the hospital during part of their recovery, the visitor guidelines for inpatient rooms apply.  Please refer to the [facility] website for the visitor guidelines for any additional information.   If you received a COVID test during your pre-op visit  it is requested that you wear a mask when out in public, stay away from anyone that may not be feeling well and notify your surgeon if you develop symptoms. If you have been in contact with anyone that has tested positive in the last 10 days please notify you surgeon.      Pre-operative CHG Bathing Instructions   You can play a key role in reducing the risk of infection after surgery. Your skin needs to be as free of germs as possible. You can reduce the number of germs on your skin by washing with CHG (chlorhexidine  gluconate) soap before surgery. CHG is an antiseptic soap that kills germs and continues to kill germs even after washing.   DO NOT use if you have an allergy to chlorhexidine /CHG or antibacterial soaps. If your skin becomes reddened or irritated, stop using the CHG and notify one of our RNs at [phone].  TAKE A SHOWER THE NIGHT BEFORE SURGERY   Please keep in mind the following:  DO NOT shave, including legs and underarms, 48 hours prior to surgery.   You may shave your face before/day of surgery.  Place clean sheets on your bed the night before surgery Use a clean washcloth (not used since being washed) for shower. DO NOT sleep with pet's night before surgery.  CHG Shower Instructions:  Wash your face and private area with normal soap. If you choose to wash your hair, wash first with your normal shampoo.  After you use shampoo/soap, rinse your  hair and body thoroughly to remove shampoo/soap residue.  Turn the water  OFF and apply half the bottle of CHG soap to a CLEAN washcloth.  Apply CHG soap ONLY FROM YOUR NECK DOWN TO YOUR TOES (washing for 3-5 minutes)  DO NOT use CHG soap on face, private areas, open wounds, or sores.  Pay special attention to the area where your surgery is being performed.  If you are having back surgery, having someone wash your back for you may be helpful. Wait 2 minutes after CHG soap is applied, then you may rinse off the CHG soap.  Pat dry with a clean towel  Put on clean pajamas    Additional instructions for the day of surgery: If you choose, you may shower the morning of surgery with an antibacterial soap.  DO NOT APPLY any lotions, deodorants, cologne, or perfumes.   Do not wear jewelry or makeup Do not wear nail polish, gel polish, artificial nails, or any other type of covering on natural nails (fingers and toes) Do not bring valuables to the hospital. [facility] is not responsible for valuables/personal belongings. Put on clean/comfortable clothes.  Please brush your teeth.  Ask your nurse before applying any prescription medications to the skin.

## 2025-01-02 NOTE — Telephone Encounter (Signed)
 Not a good move on his part.  Those tears are not going to keep

## 2025-01-03 ENCOUNTER — Encounter (HOSPITAL_COMMUNITY)
Admission: RE | Admit: 2025-01-03 | Discharge: 2025-01-03 | Disposition: A | Source: Ambulatory Visit | Attending: Orthopedic Surgery

## 2025-01-03 ENCOUNTER — Encounter (HOSPITAL_COMMUNITY): Payer: Self-pay

## 2025-01-03 ENCOUNTER — Other Ambulatory Visit: Payer: Self-pay

## 2025-01-03 VITALS — BP 129/88 | HR 78 | Temp 98.4°F | Resp 19 | Ht 70.5 in | Wt 288.7 lb

## 2025-01-03 DIAGNOSIS — Z01812 Encounter for preprocedural laboratory examination: Secondary | ICD-10-CM | POA: Diagnosis not present

## 2025-01-03 DIAGNOSIS — Z01818 Encounter for other preprocedural examination: Secondary | ICD-10-CM | POA: Diagnosis present

## 2025-01-03 LAB — BASIC METABOLIC PANEL WITH GFR
Anion gap: 8 (ref 5–15)
BUN: 16 mg/dL (ref 6–20)
CO2: 28 mmol/L (ref 22–32)
Calcium: 9.2 mg/dL (ref 8.9–10.3)
Chloride: 104 mmol/L (ref 98–111)
Creatinine, Ser: 0.93 mg/dL (ref 0.61–1.24)
GFR, Estimated: >60 mL/min
Glucose, Bld: 97 mg/dL (ref 70–99)
Potassium: 4.8 mmol/L (ref 3.5–5.1)
Sodium: 140 mmol/L (ref 135–145)

## 2025-01-03 LAB — CBC
HCT: 48.2 % (ref 39.0–52.0)
Hemoglobin: 16.5 g/dL (ref 13.0–17.0)
MCH: 28.9 pg (ref 26.0–34.0)
MCHC: 34.2 g/dL (ref 30.0–36.0)
MCV: 84.4 fL (ref 80.0–100.0)
Platelets: 289 K/uL (ref 150–400)
RBC: 5.71 MIL/uL (ref 4.22–5.81)
RDW: 12.6 % (ref 11.5–15.5)
WBC: 7.2 K/uL (ref 4.0–10.5)
nRBC: 0 % (ref 0.0–0.2)

## 2025-01-03 NOTE — Progress Notes (Signed)
 PCP - Mliss Gareth BRISTLE Cardiologist - denies  PPM/ICD - denies Device Orders -  Rep Notified -   Chest x-ray - na EKG - na Stress Test - denies ECHO - denies Cardiac Cath - denies  Sleep Study - +OSA CPAP - does not use  Fasting Blood Sugar - na Checks Blood Sugar _____ times a day  Last dose of GLP1 agonist-  pt is not taking GLP1 instructions:   Blood Thinner Instructions:na Aspirin Instructions:na  ERAS Protcol -clears until 12:15 PRE-SURGERY Ensure or G2- Ensure  COVID TEST- na   Anesthesia review: no  Patient denies shortness of breath, fever, cough and chest pain at PAT appointment   All instructions explained to the patient, with a verbal understanding of the material. Patient agrees to go over the instructions while at home for a better understanding. The opportunity to ask questions was provided.

## 2025-01-03 NOTE — Progress Notes (Signed)
 Surgical Instructions   Your procedure is scheduled on Tuesday, January 13th, 2026. Report to [facility] A at 1:15 P.M., then check in with the Admitting office. Any questions or running late day of surgery: call [phone]  Questions prior to your surgery date: call [phone], Monday-Friday, 8am-4pm. If you experience any cold or flu symptoms such as cough, fever, chills, shortness of breath, etc. between now and your scheduled surgery, please notify us  at the above number.     Remember:  Do not eat after midnight the night before your surgery   You may drink clear liquids until 12:15 PM the day of your surgery.   Clear liquids allowed are: Water , Non-Citrus Juices (without pulp), Carbonated Beverages, Clear Tea (no milk, honey, etc.), Black Coffee Only (NO MILK, CREAM OR POWDERED CREAMER of any kind), and Gatorade.  Patient Instructions  The night before surgery:  No food after midnight. ONLY clear liquids after midnight  The day of surgery (if you do NOT have diabetes):  Drink ONE (1) Pre-Surgery Clear Ensure by 12:15pm the morning of surgery. Drink in one sitting. Do not sip.  This drink was given to you during your hospital  pre-op appointment visit.  Nothing else to drink after completing the  Pre-Surgery Clear Ensure.         If you have questions, please contact your surgeons office.     Take these medicines the morning of surgery with A SIP OF WATER : Gabapentin  (Neurontin ) Omeprazole  (Prilosec)   May take these medicines IF NEEDED: None    One week prior to surgery, STOP taking any Aspirin (unless otherwise instructed by your surgeon) Aleve , Naproxen , Ibuprofen, Motrin, Advil, Goody's, BC's, all herbal medications, fish oil, and non-prescription vitamins.  This includes your Naproxen  (Naprosyn ).                     Do NOT Smoke (Tobacco/Vaping) for 24 hours prior to your procedure.  If you use a CPAP at night, you may bring your  mask/headgear for your overnight stay.   You will be asked to remove any contacts, glasses, piercing's, hearing aid's, dentures/partials prior to surgery. Please bring cases for these items if needed.    Patients discharged the day of surgery will not be allowed to drive home, and someone needs to stay with them for 24 hours.  SURGICAL WAITING ROOM VISITATION Patients may have no more than 2 support people in the waiting area - these visitors may rotate.   Pre-op nurse will coordinate an appropriate time for 2 ADULT support persons, who may not rotate, to accompany patient in pre-op.  Children under the age of 59 must have an adult with them who is not the patient and must remain in the main waiting area with an adult.  If the patient needs to stay at the hospital during part of their recovery, the visitor guidelines for inpatient rooms apply.  Please refer to the [facility] website for the visitor guidelines for any additional information.   If you received a COVID test during your pre-op visit  it is requested that you wear a mask when out in public, stay away from anyone that may not be feeling well and notify your surgeon if you develop symptoms. If you have been in contact with anyone that has tested positive in the last 10 days please notify you surgeon.      Pre-operative CHG Bathing Instructions   You can play a key role  in reducing the risk of infection after surgery. Your skin needs to be as free of germs as possible. You can reduce the number of germs on your skin by washing with CHG (chlorhexidine  gluconate) soap before surgery. CHG is an antiseptic soap that kills germs and continues to kill germs even after washing.   DO NOT use if you have an allergy to chlorhexidine /CHG or antibacterial soaps. If your skin becomes reddened or irritated, stop using the CHG and notify one of our RNs at [phone].              TAKE A SHOWER THE NIGHT BEFORE SURGERY   Please keep in mind  the following:  DO NOT shave, including legs and underarms, 48 hours prior to surgery.   You may shave your face before/day of surgery.  Place clean sheets on your bed the night before surgery Use a clean washcloth (not used since being washed) for shower. DO NOT sleep with pet's night before surgery.  CHG Shower Instructions:  Wash your face and private area with normal soap. If you choose to wash your hair, wash first with your normal shampoo.  After you use shampoo/soap, rinse your hair and body thoroughly to remove shampoo/soap residue.  Turn the water  OFF and apply half the bottle of CHG soap to a CLEAN washcloth.  Apply CHG soap ONLY FROM YOUR NECK DOWN TO YOUR TOES (washing for 3-5 minutes)  DO NOT use CHG soap on face, private areas, open wounds, or sores.  Pay special attention to the area where your surgery is being performed.  If you are having back surgery, having someone wash your back for you may be helpful. Wait 2 minutes after CHG soap is applied, then you may rinse off the CHG soap.  Pat dry with a clean towel  Put on clean pajamas    Additional instructions for the day of surgery: If you choose, you may shower the morning of surgery with an antibacterial soap.  DO NOT APPLY any lotions, deodorants, cologne, or perfumes.   Do not wear jewelry or makeup Do not wear nail polish, gel polish, artificial nails, or any other type of covering on natural nails (fingers and toes) Do not bring valuables to the hospital. [facility] is not responsible for valuables/personal belongings. Put on clean/comfortable clothes.  Please brush your teeth.  Ask your nurse before applying any prescription medications to the skin.

## 2025-01-06 NOTE — Anesthesia Preprocedure Evaluation (Signed)
"                                    Anesthesia Evaluation  Patient identified by MRN, date of birth, ID band Patient awake    Reviewed: Allergy & Precautions, NPO status , Patient's Chart, lab work & pertinent test results  Airway Mallampati: IV  TM Distance: >3 FB Neck ROM: Full    Dental no notable dental hx. (+) Teeth Intact, Dental Advisory Given   Pulmonary sleep apnea and Continuous Positive Airway Pressure Ventilation , former smoker   Pulmonary exam normal breath sounds clear to auscultation       Cardiovascular negative cardio ROS Normal cardiovascular exam Rhythm:Regular Rate:Normal     Neuro/Psych   Anxiety     negative neurological ROS  negative psych ROS   GI/Hepatic Neg liver ROS,GERD  ,,  Endo/Other    Class 3 obesity (BMI 41)  Renal/GU negative Renal ROS  negative genitourinary   Musculoskeletal negative musculoskeletal ROS (+)    Abdominal   Peds  Hematology negative hematology ROS (+)   Anesthesia Other Findings   Reproductive/Obstetrics                              Anesthesia Physical Anesthesia Plan  ASA: 3  Anesthesia Plan: General and Regional   Post-op Pain Management: Regional block* and Tylenol  PO (pre-op)*   Induction: Intravenous  PONV Risk Score and Plan: 2 and Midazolam , Dexamethasone  and Ondansetron   Airway Management Planned: Oral ETT  Additional Equipment:   Intra-op Plan:   Post-operative Plan: Extubation in OR  Informed Consent: I have reviewed the patients History and Physical, chart, labs and discussed the procedure including the risks, benefits and alternatives for the proposed anesthesia with the patient or authorized representative who has indicated his/her understanding and acceptance.     Dental advisory given  Plan Discussed with: CRNA  Anesthesia Plan Comments:          Anesthesia Quick Evaluation  "

## 2025-01-07 ENCOUNTER — Ambulatory Visit (HOSPITAL_COMMUNITY)
Admission: RE | Admit: 2025-01-07 | Discharge: 2025-01-07 | Disposition: A | Attending: Orthopedic Surgery | Admitting: Orthopedic Surgery

## 2025-01-07 ENCOUNTER — Encounter (HOSPITAL_COMMUNITY): Payer: Self-pay | Admitting: Anesthesiology

## 2025-01-07 ENCOUNTER — Encounter (HOSPITAL_COMMUNITY): Admission: RE | Disposition: A | Payer: Self-pay | Source: Home / Self Care | Attending: Orthopedic Surgery

## 2025-01-07 ENCOUNTER — Other Ambulatory Visit: Payer: Self-pay

## 2025-01-07 ENCOUNTER — Ambulatory Visit (HOSPITAL_COMMUNITY): Payer: Self-pay | Admitting: Anesthesiology

## 2025-01-07 ENCOUNTER — Encounter (HOSPITAL_COMMUNITY): Payer: Self-pay | Admitting: Orthopedic Surgery

## 2025-01-07 DIAGNOSIS — M65911 Unspecified synovitis and tenosynovitis, right shoulder: Secondary | ICD-10-CM

## 2025-01-07 DIAGNOSIS — W1789XA Other fall from one level to another, initial encounter: Secondary | ICD-10-CM | POA: Insufficient documentation

## 2025-01-07 DIAGNOSIS — M75101 Unspecified rotator cuff tear or rupture of right shoulder, not specified as traumatic: Secondary | ICD-10-CM | POA: Diagnosis not present

## 2025-01-07 DIAGNOSIS — S46811A Strain of other muscles, fascia and tendons at shoulder and upper arm level, right arm, initial encounter: Secondary | ICD-10-CM | POA: Diagnosis present

## 2025-01-07 DIAGNOSIS — Z01818 Encounter for other preprocedural examination: Secondary | ICD-10-CM

## 2025-01-07 DIAGNOSIS — M75121 Complete rotator cuff tear or rupture of right shoulder, not specified as traumatic: Secondary | ICD-10-CM | POA: Diagnosis not present

## 2025-01-07 DIAGNOSIS — G4730 Sleep apnea, unspecified: Secondary | ICD-10-CM | POA: Diagnosis not present

## 2025-01-07 DIAGNOSIS — S43431A Superior glenoid labrum lesion of right shoulder, initial encounter: Secondary | ICD-10-CM

## 2025-01-07 DIAGNOSIS — E66813 Obesity, class 3: Secondary | ICD-10-CM | POA: Insufficient documentation

## 2025-01-07 DIAGNOSIS — S43001A Unspecified subluxation of right shoulder joint, initial encounter: Secondary | ICD-10-CM

## 2025-01-07 DIAGNOSIS — S43003A Unspecified subluxation of unspecified shoulder joint, initial encounter: Secondary | ICD-10-CM

## 2025-01-07 DIAGNOSIS — K219 Gastro-esophageal reflux disease without esophagitis: Secondary | ICD-10-CM | POA: Insufficient documentation

## 2025-01-07 DIAGNOSIS — Z6841 Body Mass Index (BMI) 40.0 and over, adult: Secondary | ICD-10-CM | POA: Insufficient documentation

## 2025-01-07 MED ORDER — VANCOMYCIN HCL 1000 MG IV SOLR
INTRAVENOUS | Status: AC
  Filled 2025-01-07: qty 20

## 2025-01-07 MED ORDER — POVIDONE-IODINE 10 % EX SWAB: 2.0000 | Freq: Once | CUTANEOUS | Status: DC

## 2025-01-07 MED ORDER — SUCCINYLCHOLINE CHLORIDE 200 MG/10ML IV SOSY
PREFILLED_SYRINGE | INTRAVENOUS | Status: AC
  Filled 2025-01-07: qty 10

## 2025-01-07 MED ORDER — OXYCODONE HCL 5 MG/5ML PO SOLN: 5.0000 mg | Freq: Once | ORAL | Status: DC | PRN

## 2025-01-07 MED ORDER — ALBUMIN HUMAN 5 % IV SOLN: INTRAVENOUS | Status: DC | PRN

## 2025-01-07 MED ORDER — ROCURONIUM BROMIDE 10 MG/ML (PF) SYRINGE
PREFILLED_SYRINGE | INTRAVENOUS | Status: DC | PRN
  Administered 2025-01-07: 5 mg via INTRAVENOUS
  Administered 2025-01-07: 40 mg via INTRAVENOUS
  Administered 2025-01-07: 5 mg via INTRAVENOUS
  Administered 2025-01-07: 10 mg via INTRAVENOUS

## 2025-01-07 MED ORDER — DEXAMETHASONE SOD PHOSPHATE PF 10 MG/ML IJ SOLN
INTRAMUSCULAR | Status: AC
  Filled 2025-01-07: qty 1

## 2025-01-07 MED ORDER — EPINEPHRINE PF 1 MG/ML IJ SOLN
INTRAMUSCULAR | Status: AC
  Filled 2025-01-07: qty 1

## 2025-01-07 MED ORDER — PROPOFOL 10 MG/ML IV BOLUS
INTRAVENOUS | Status: DC | PRN
  Administered 2025-01-07: 150 mg via INTRAVENOUS

## 2025-01-07 MED ORDER — ACETAMINOPHEN 500 MG PO TABS: 1000.0000 mg | ORAL_TABLET | Freq: Once | ORAL | Status: AC

## 2025-01-07 MED ORDER — LACTATED RINGERS IV SOLN: INTRAVENOUS | Status: DC

## 2025-01-07 MED ORDER — FENTANYL CITRATE (PF) 100 MCG/2ML IJ SOLN
INTRAMUSCULAR | Status: AC
  Administered 2025-01-07: 50 ug via INTRAVENOUS
  Filled 2025-01-07: qty 2

## 2025-01-07 MED ORDER — ACETAMINOPHEN 500 MG PO TABS
ORAL_TABLET | ORAL | Status: AC
  Administered 2025-01-07: 1000 mg via ORAL
  Filled 2025-01-07: qty 2

## 2025-01-07 MED ORDER — CEFAZOLIN SODIUM-DEXTROSE 2-4 GM/100ML-% IV SOLN
INTRAVENOUS | Status: AC
  Filled 2025-01-07: qty 100

## 2025-01-07 MED ORDER — SUCCINYLCHOLINE CHLORIDE 200 MG/10ML IV SOSY
PREFILLED_SYRINGE | INTRAVENOUS | Status: DC | PRN
  Administered 2025-01-07: 180 mg via INTRAVENOUS

## 2025-01-07 MED ORDER — ROCURONIUM BROMIDE 10 MG/ML (PF) SYRINGE
PREFILLED_SYRINGE | INTRAVENOUS | Status: AC
  Filled 2025-01-07: qty 10

## 2025-01-07 MED ORDER — FENTANYL CITRATE (PF) 100 MCG/2ML IJ SOLN
INTRAMUSCULAR | Status: AC
  Filled 2025-01-07: qty 2

## 2025-01-07 MED ORDER — CHLORHEXIDINE GLUCONATE 0.12 % MT SOLN
OROMUCOSAL | Status: AC
  Administered 2025-01-07: 15 mL via OROMUCOSAL
  Filled 2025-01-07: qty 15

## 2025-01-07 MED ORDER — MIDAZOLAM HCL (PF) 2 MG/2ML IJ SOLN: 2.0000 mg | Freq: Once | INTRAMUSCULAR | Status: AC

## 2025-01-07 MED ORDER — IRRISEPT - 450ML BOTTLE WITH 0.05% CHG IN STERILE WATER, USP 99.95% OPTIME
TOPICAL | Status: DC | PRN
  Administered 2025-01-07: 450 mL

## 2025-01-07 MED ORDER — METHOCARBAMOL 500 MG PO TABS: 500.0000 mg | ORAL_TABLET | Freq: Three times a day (TID) | ORAL | 1 refills | Status: AC | PRN

## 2025-01-07 MED ORDER — SODIUM CHLORIDE (PF) 0.9 % IJ SOLN
INTRAMUSCULAR | Status: DC | PRN
  Administered 2025-01-07: 3001 mL

## 2025-01-07 MED ORDER — VANCOMYCIN HCL 1000 MG IV SOLR
INTRAVENOUS | Status: DC | PRN
  Administered 2025-01-07: 1000 mg

## 2025-01-07 MED ORDER — ONDANSETRON HCL 4 MG/2ML IJ SOLN
INTRAMUSCULAR | Status: DC | PRN
  Administered 2025-01-07: 4 mg via INTRAVENOUS

## 2025-01-07 MED ORDER — ORAL CARE MOUTH RINSE: 15.0000 mL | Freq: Once | OROMUCOSAL | Status: AC

## 2025-01-07 MED ORDER — AMISULPRIDE (ANTIEMETIC) 5 MG/2ML IV SOLN: 10.0000 mg | Freq: Once | INTRAVENOUS | Status: DC | PRN

## 2025-01-07 MED ORDER — TRANEXAMIC ACID-NACL 1000-0.7 MG/100ML-% IV SOLN
1000.0000 mg | INTRAVENOUS | Status: AC
  Administered 2025-01-07: 1000 mg via INTRAVENOUS

## 2025-01-07 MED ORDER — OXYCODONE HCL 5 MG PO TABS: 5.0000 mg | ORAL_TABLET | ORAL | 0 refills | Status: AC | PRN

## 2025-01-07 MED ORDER — TRANEXAMIC ACID-NACL 1000-0.7 MG/100ML-% IV SOLN
INTRAVENOUS | Status: AC
  Filled 2025-01-07: qty 100

## 2025-01-07 MED ORDER — LIDOCAINE 2% (20 MG/ML) 5 ML SYRINGE
INTRAMUSCULAR | Status: AC
  Filled 2025-01-07: qty 5

## 2025-01-07 MED ORDER — CEFAZOLIN SODIUM-DEXTROSE 2-4 GM/100ML-% IV SOLN
2.0000 g | INTRAVENOUS | Status: AC
  Administered 2025-01-07: 3 g via INTRAVENOUS

## 2025-01-07 MED ORDER — LIDOCAINE 2% (20 MG/ML) 5 ML SYRINGE
INTRAMUSCULAR | Status: DC | PRN
  Administered 2025-01-07: 50 mg via INTRAVENOUS

## 2025-01-07 MED ORDER — MIDAZOLAM HCL 2 MG/2ML IJ SOLN
INTRAMUSCULAR | Status: AC
  Administered 2025-01-07: 2 mg via INTRAVENOUS
  Filled 2025-01-07: qty 2

## 2025-01-07 MED ORDER — CHLORHEXIDINE GLUCONATE 0.12 % MT SOLN: 15.0000 mL | Freq: Once | OROMUCOSAL | Status: AC

## 2025-01-07 MED ORDER — OXYCODONE HCL 5 MG PO TABS: 5.0000 mg | ORAL_TABLET | Freq: Once | ORAL | Status: DC | PRN

## 2025-01-07 MED ORDER — SUGAMMADEX SODIUM 200 MG/2ML IV SOLN
INTRAVENOUS | Status: DC | PRN
  Administered 2025-01-07: 249.4 mg via INTRAVENOUS

## 2025-01-07 MED ORDER — PHENYLEPHRINE HCL-NACL 20-0.9 MG/250ML-% IV SOLN
INTRAVENOUS | Status: DC | PRN
  Administered 2025-01-07: 50 ug/min via INTRAVENOUS

## 2025-01-07 MED ORDER — ONDANSETRON HCL 4 MG/2ML IJ SOLN
INTRAMUSCULAR | Status: AC
  Filled 2025-01-07: qty 2

## 2025-01-07 MED ORDER — PROPOFOL 10 MG/ML IV BOLUS
INTRAVENOUS | Status: AC
  Filled 2025-01-07: qty 20

## 2025-01-07 MED ORDER — POVIDONE-IODINE 7.5 % EX SOLN
Freq: Once | CUTANEOUS | Status: DC
  Filled 2025-01-07: qty 118

## 2025-01-07 MED ORDER — 0.9 % SODIUM CHLORIDE (POUR BTL) OPTIME
TOPICAL | Status: DC | PRN
  Administered 2025-01-07: 1000 mL

## 2025-01-07 MED ORDER — FENTANYL CITRATE (PF) 100 MCG/2ML IJ SOLN: 50.0000 ug | Freq: Once | INTRAMUSCULAR | Status: AC

## 2025-01-07 MED ORDER — DEXAMETHASONE SOD PHOSPHATE PF 10 MG/ML IJ SOLN
INTRAMUSCULAR | Status: DC | PRN
  Administered 2025-01-07: 8 mg via INTRAVENOUS

## 2025-01-07 MED ORDER — FENTANYL CITRATE (PF) 100 MCG/2ML IJ SOLN: 25.0000 ug | INTRAMUSCULAR | Status: DC | PRN

## 2025-01-07 MED ORDER — PHENYLEPHRINE 80 MCG/ML (10ML) SYRINGE FOR IV PUSH (FOR BLOOD PRESSURE SUPPORT)
PREFILLED_SYRINGE | INTRAVENOUS | Status: AC
  Filled 2025-01-07: qty 10

## 2025-01-07 NOTE — Anesthesia Procedure Notes (Signed)
 Procedure Name: Intubation Date/Time: 01/07/2025 11:29 AM  Performed by: Worth Peppers, CRNAPre-anesthesia Checklist: Patient identified, Emergency Drugs available, Suction available and Patient being monitored Patient Re-evaluated:Patient Re-evaluated prior to induction Oxygen Delivery Method: Circle System Utilized Preoxygenation: Pre-oxygenation with 100% oxygen Induction Type: IV induction Ventilation: Mask ventilation without difficulty Laryngoscope Size: Glidescope, Mac and 4 Grade View: Grade I Tube type: Oral Number of attempts: 1 Airway Equipment and Method: Stylet and Oral airway Placement Confirmation: ETT inserted through vocal cords under direct vision, positive ETCO2 and breath sounds checked- equal and bilateral Secured at: 23 cm Tube secured with: Tape Dental Injury: Teeth and Oropharynx as per pre-operative assessment

## 2025-01-07 NOTE — Transfer of Care (Signed)
 Immediate Anesthesia Transfer of Care Note  Patient: Nathan [name]  Procedure(s) Performed: RIGHT SHOULDER ARTHROSCOPY, DEBRIDEMENT, BICEP TENODESIS, ROTATOR CUFF TEAR REPAIR (Right: Shoulder)  Patient Location: PACU  Anesthesia Type:GA combined with regional for post-op pain  Level of Consciousness: awake  Airway & Oxygen Therapy: Patient Spontanous Breathing and Patient connected to face mask oxygen  Post-op Assessment: Report given to RN and Post -op Vital signs reviewed and stable  Post vital signs: Reviewed and stable  Last Vitals:  Vitals Value Taken Time  BP 137/78 01/07/25 14:47  Temp 98   Pulse 97 01/07/25 14:53  Resp 19 01/07/25 14:53  SpO2 84 % 01/07/25 14:53  Vitals shown include unfiled device data.  Last Pain:  Vitals:   01/07/25 1025  TempSrc:   PainSc: 0-No pain         Complications: No notable events documented.

## 2025-01-07 NOTE — H&P (Signed)
 Nathan [name] is an 49 y.o. male.   Chief Complaint: right shoulder pain HPI: Nathan [name] is a 49 y.o. male who presents  reporting right shoulder pain.  Patient works as an personnel officer.  Right-hand-dominant.  Shoulder pain wakes the patient from sleep at night.  Patient states he fell through a deck in May 2025.  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  No history of diabetes or heart disease.  Wife has MS.   MRI scan is reviewed.  Does show 1 cm tear of the supraspinatus but he also has a subscapularis tear.  Retraction there is about 2 cm.  Medial subluxation of the biceps is present which is symptomatic for this patient.  There is mild to moderate atrophy of the supraspinatus and mild atrophy of the subscap..   Past Medical History:  Diagnosis Date   GERD (gastroesophageal reflux disease)    OSA (obstructive sleep apnea)     Past Surgical History:  Procedure Laterality Date   COLONOSCOPY WITH PROPOFOL  N/A 10/20/2017   Procedure: COLONOSCOPY WITH PROPOFOL ;  Surgeon: Jinny Carmine, MD;  Location: [facility];  Service: Endoscopy;  Laterality: N/A;   COLONOSCOPY WITH PROPOFOL  N/A 11/16/2020   Procedure: COLONOSCOPY WITH BIOPSY;  Surgeon: Jinny Carmine, MD;  Location: [facility];  Service: Endoscopy;  Laterality: N/A;  Sleep apnea priority 4   ESOPHAGOGASTRODUODENOSCOPY (EGD) WITH PROPOFOL  N/A 10/20/2017   Procedure: ESOPHAGOGASTRODUODENOSCOPY (EGD) WITH PROPOFOL ;  Surgeon: Jinny Carmine, MD;  Location: [facility];  Service: Endoscopy;  Laterality: N/A;  sleep apnea   GALLBLADDER SURGERY     POLYPECTOMY N/A 11/16/2020   Procedure: POLYPECTOMY;  Surgeon: Jinny Carmine, MD;  Location: [facility];  Service: Endoscopy;  Laterality: N/A;    Family History  Problem Relation Age of Onset   Cancer Mother    Stroke Father    Heart disease Father    Diabetes Father    Social History:  reports that he quit smoking  about 18 years ago. His smoking use included cigarettes. He started smoking about 34 years ago. He has a 16 pack-year smoking history. His smokeless tobacco use includes snuff and chew. He reports current alcohol use. He reports that he does not use drugs.  Allergies: Allergies[1]  Medications Prior to Admission  Medication Sig Dispense Refill   gabapentin  (NEURONTIN ) 300 MG capsule TAKE 2 CAPSULES BY MOUTH 3 TIMES DAILY. 540 capsule 1   naproxen  (NAPROSYN ) 500 MG tablet Take 1 tablet by mouth 2 (two) times daily with a meal.     omeprazole  (PRILOSEC) 20 MG capsule Take 20 mg by mouth daily.     diclofenac  (VOLTAREN ) 75 MG EC tablet Take 1 tablet (75 mg total) by mouth 2 (two) times daily. (Patient not taking: Reported on 01/01/2025) 45 tablet 1   escitalopram  (LEXAPRO ) 10 MG tablet Take 1 tablet (10 mg total) by mouth daily. (Patient not taking: Reported on 01/01/2025) 90 tablet 1   fluticasone  (FLONASE ) 50 MCG/ACT nasal spray Place 2 sprays into both nostrils daily. (Patient not taking: Reported on 01/01/2025) 16 g 6   WEGOVY  0.25 MG/0.5ML SOAJ Inject 0.25 mg into the skin once a week. Use this dose for 1 month (4 shots) and then increase to next higher dose. (Patient not taking: Reported on 01/01/2025) 2 mL 0    No results found for this or any previous visit (from the past 48 hours). No results found.  Review of Systems  Musculoskeletal:  Positive for arthralgias.  All other systems reviewed and are negative.   Blood pressure 124/69, pulse 72, temperature 97.7 F (36.5 C), temperature source Oral, resp. rate 18, height 5' 10.5 (1.791 m), weight 124.7 kg, SpO2 96%. Physical Exam Vitals reviewed.  HENT:     Head: Normocephalic.     Nose: Nose normal.     Mouth/Throat:     Mouth: Mucous membranes are moist.  Eyes:     Pupils: Pupils are equal, round, and reactive to light.  Cardiovascular:     Rate and Rhythm: Normal rate.     Pulses: Normal pulses.  Pulmonary:     Effort: Pulmonary  effort is normal.  Abdominal:     General: Abdomen is flat.  Musculoskeletal:     Cervical back: Normal range of motion.  Skin:    General: Skin is warm.     Capillary Refill: Capillary refill takes less than 2 seconds.  Neurological:     General: No focal deficit present.     Mental Status: He is alert.  Psychiatric:        Mood and Affect: Mood normal.    Ortho exam demonstrates passive external rotation on the right of 75 on the left 55 subscap strength 5- out of 5 on the right 5+ out of 5 on the left. Does have pretty reasonable passive range of motion with bicipital groove tenderness on the right negative on the left. No AC joint tenderness on the right compared to the left. Cervical spine range of motion is full. There is some coarseness and popping with passive range of motion on that right-hand side at 90 degrees of abduction.   Assessment/Plan Impression is right shoulder pain with increased external rotation on the right consistent with subscap tear. Biceps tendon subluxation is likely the most symptomatic pain generator in the shoulder at this time. Plan is arthroscopy and debridement with open rotator cuff tear repair of the subscap biceps tenodesis and supraspinatus tendon repair. May have to augment the subscap repair with patch depending on tension on the repair. Mobilization will be required. Repairing the supraspinatus in a watertight fashion will go a long way towards improving the shoulder function along with biceps tenodesis. The risk and benefits are discussed with Lamar including not limited to infection or vessel damage incomplete pain relief along with incomplete restoration of function. The extensive nature of the rehabilitative process is discussed. Depending on the tension on the subscap repair we may be able to get him out of the sling by 4 weeks. All questions answered. Also depending on the subscap repair tension we may use CPM machine.   KANDICE Glendia Hutchinson, MD 01/07/2025,  10:44 AM       [1] No Known Allergies

## 2025-01-07 NOTE — Op Note (Unsigned)
 Nathan [name], Nathan [name] MEDICAL RECORD NO: [id number] ACCOUNT NO: [id number] DATE OF BIRTH: [birth year] FACILITY: [facility] LOCATION: [facility] PHYSICIAN: Cordella RAMAN. Addie, MD  Operative Report   DATE OF PROCEDURE: 01/07/2025  PREOPERATIVE DIAGNOSES:  Right shoulder subscapularis tear with biceps tendon subluxation.  Supraspinatus tear.  Superior labrum anterior to posterior tear.  POSTOPERATIVE DIAGNOSES:  Right shoulder subscapularis tear with biceps tendon subluxation.  Supraspinatus tear.  Superior labrum anterior to posterior tear.  PROCEDURE:  Right shoulder arthroscopy with debridement of the superior labral tear and rotator cuff with subsequent open biceps tenodesis and repair of the subscapularis using a 2 x 2 construct and repair of the supraspinatus using a 2 x 2 construct.  SURGEON:  Cordella RAMAN. Addie, MD.  ASSISTANT:  Herlene Calix.  INDICATIONS:  The patient is a 49 year old patient who fell through a deck in May.  He has had right shoulder pain since that time.  He presents now for operative management after explanation of risks and benefits.  MRI scan shows tear of the  subscapularis with 2 cm retraction along with biceps tendon subluxation as well as retracted tear of the supraspinatus tendon.  DESCRIPTION OF PROCEDURE:  The patient was brought to the operating room where general anesthetic was induced.  Preoperative antibiotics were administered.  Timeout was called.  The patient was placed in the beach chair position with the head in a  neutral position.  Both shoulders were examined.  The patient had external rotation on the left to about 50 degrees and on the right, 90 degrees.  Shoulder stability was intact on the right-hand side with 1.5+ anterior laxity with solid endpoint and 1+  posterior laxity with less than 1 cm sulcus sign.  Following examination under anesthesia, the right shoulder, arm, and hand were prescrubbed with alcohol and Betadine  and then allowed to air dry, then  prepped with DuraPrep solution and draped in a  sterile manner.  Ioban was used to seal the operative field and cover the axilla.  After calling timeout, a posterior portal was created.  An anterior portal was created in line with the anticipated deltopectoral incision.  Arthroscopy was performed.   The patient had intact glenohumeral articular surfaces.  The anterior inferior and posterior inferior glenoid labrum were intact.  The patient did have a degenerative/traumatic tear of the superior labrum from the 10 o'clock to 2 o'clock position.  After  creation of the anterior portal, the biceps tendon was released.  Debridement was performed on the superior labrum.  There was also some tearing of the rotator cuff supraspinatus, which was also debrided using the ArthroCare wand.  The intraarticular  subscapularis was torn and retracted about 2 cm.  At this time, instruments were removed from the portals, which were then closed using 3-0 nylon suture.  Ioban was used to cover the operative field at this time.  A deltopectoral approach was made.  Skin and subcutaneous tissue were sharply divided.  The cephalic vein was mobilized laterally.  The deltopectoral interval was developed.  The axillary nerve was palpated and protected at all times during the case.   Ligation of the circumflex vessels was not required in this case.  At this time, Kolbel retractors were placed.  The CA ligament was only partially released for visualization of the rotator interval.  The supraspinatus tendon was torn and retracted 2 cm.   A bursectomy was performed.  The rotator cuff tear was tagged with 5-0 FiberWire sutures.  The tear was mobilized with  release of the coracohumeral ligament.  At this time, the biceps tendon was identified, and the bicipital groove was opened.  The  biceps tendon was tagged.  The rotator interval was then opened up to the base of the coracoid, and the subscapularis was subsequently mobilized after  detaching some bursal tissue and remnant capsular tissue from the lesser tuberosity.  The excess tissue  was released from the normal, but torn subscapularis tissue.  We placed 4-0 Vicryl sutures in that subscapularis tendon, which was then mobilized circumferentially with care being taken to avoid injury to the axillary nerve.  At this time, a grasping  suture was placed around the biceps tendon for later tenodesis below the attachment of the subscapularis.  The first thing that was performed was preparation of the lesser tuberosity for repair of the supraspinatus.  Two Arthrex suture anchors with 4  suture tapes each were placed at the junction of the articular surface and the greater tuberosity.  Those suture tapes were then passed with the Scorpion suture passer from posterior to anterior with the rotator cuff tendon reduced.  At this time, the  suture tapes were tied with the rotator cuff tendon reduced to the footprint.  After tying, the suture tapes were crossed, and the 4 suture tapes posteriorly were matched with 3 traction sutures posteriorly, and the 4 suture tapes anteriorly were matched  with the 2 traction sutures anteriorly and placed in SwiveLocks with the arm in abduction for a nice watertight repair.  Attention was then directed towards the biceps tendon.  We used a SwiveLock for tenodesis of the biceps tendon in the inferior  portion of the groove below the subscapularis attachment site.  This was then oversewn using 5-0 Vicryl sutures, and this was also done under appropriate tension to prevent Popeye deformity.  Next, the landing zone over the lesser trochanter and lesser  tuberosity was prepared.  We placed 2 Arthrex suture tape anchors at the junction of the articular surface and the medial aspect of the lesser tuberosity.  Those 8 suture tapes were then placed inferior to superior through the subscapularis tendon, and  with the traction sutures, the tendon was mobilized back to its  attachment site, and the suture tapes were tied and crossed.  The 4 inferior limbs were matched with the 2 inferior traction sutures and placed in the bicipital groove into a SwiveLock with  very good fixation achieved.  In a similar fashion, the superior suture tapes with the traction sutures were placed in the bicipital groove superiorly, again with watertight repair achieved.  At this time, the accessory sutures from the SwiveLocks were  utilized to reinforce the repair.  The rotator interval was then closed with the arm in 30 degrees of external rotation using 0 Vicryl suture.  The arm was taken through a range of motion and found to have very good stability and about 40 degrees of  external rotation before the repair tightened.  Thorough irrigation was performed with a pulsatile irrigation, and then IrriSept solution was utilized after the incision and at multiple times during the case.  Next, the deltopectoral interval was filled  with vancomycin  powder and then closed using #1 Vicryl suture, followed by an interrupted inverted 0 Vicryl suture, 2-0 Vicryl suture, and 3-0 Monocryl with Steri-Strips and impervious dressings applied.    Luke's assistance was required at all times for retraction, opening, closing, and mobilization of tissue.  His assistance was a medical necessity.   CHR D: 01/07/2025  2:51:25 pm T: 01/07/2025 3:21:00 pm  JOB: [id number]/ [id number]

## 2025-01-07 NOTE — Brief Op Note (Signed)
" ° °  01/07/2025  2:40 PM  PATIENT:  Nathan [name]  49 y.o. male  PRE-OPERATIVE DIAGNOSIS:  right shoulder supraspinatus/subscapularis tear, biceps subluxation  POST-OPERATIVE DIAGNOSIS:  right shoulder supraspinatus/subscapularis tear, biceps subluxation  PROCEDURE:  Procedures: RIGHT SHOULDER ARTHROSCOPY, DEBRIDEMENT, BICEP TENODESIS, ROTATOR CUFF TEAR REPAIR of the subscapularis and supraspinatus  SURGEON:  Surgeon(s): Addie, Cordella Hamilton, MD  ASSISTANT: Magnant PA  ANESTHESIA:   general  EBL: 15 ml    Total I/O In: 1500 [I.V.:1100; IV Piggyback:400] Out: -   BLOOD ADMINISTERED: none  DRAINS: none   LOCAL MEDICATIONS USED: Vanco powder  SPECIMEN:  No Specimen  COUNTS:  YES  TOURNIQUET:  * No tourniquets in log *  DICTATION: .Other Dictation: Dictation Number [id number]  PLAN OF CARE: Discharge to home after PACU  PATIENT DISPOSITION:  PACU - hemodynamically stable              "

## 2025-01-08 MED ORDER — BUPIVACAINE LIPOSOME 1.3 % IJ SUSP
INTRAMUSCULAR | Status: DC | PRN
  Administered 2025-01-07: 10 mL via PERINEURAL

## 2025-01-08 MED ORDER — BUPIVACAINE HCL (PF) 0.5 % IJ SOLN
INTRAMUSCULAR | Status: DC | PRN
  Administered 2025-01-07: 15 mL via PERINEURAL

## 2025-01-08 NOTE — Anesthesia Procedure Notes (Addendum)
 Anesthesia Regional Block: Interscalene brachial plexus block   Pre-Anesthetic Checklist: , timeout performed,  Correct Patient, Correct Site, Correct Laterality,  Correct Procedure, Correct Position, site marked,  Risks and benefits discussed,  Pre-op evaluation,  At surgeon's request and post-op pain management  Laterality: Right  Prep: Maximum Sterile Barrier Precautions used, chloraprep       Needles:  Injection technique: Single-shot  Needle Type: Echogenic Stimulator Needle     Needle Length: 9cm  Needle Gauge: 21     Additional Needles:   Procedures:,,,, ultrasound used (permanent image in chart),,    Narrative:  Start time: 01/08/2025 10:05 AM End time: 01/08/2025 10:07 AM Injection made incrementally with aspirations every 5 mL. Anesthesiologist: Niels Marien CROME, MD

## 2025-01-08 NOTE — Anesthesia Postprocedure Evaluation (Signed)
"   Anesthesia Post Note  Patient: Nathan [name]  Procedure(s) Performed: RIGHT SHOULDER ARTHROSCOPY, DEBRIDEMENT, BICEP TENODESIS, ROTATOR CUFF TEAR REPAIR (Right: Shoulder)     Patient location during evaluation: PACU Anesthesia Type: Regional and General Level of consciousness: awake and alert Pain management: pain level controlled Vital Signs Assessment: post-procedure vital signs reviewed and stable Respiratory status: spontaneous breathing, nonlabored ventilation, respiratory function stable and patient connected to nasal cannula oxygen Cardiovascular status: blood pressure returned to baseline and stable Postop Assessment: no apparent nausea or vomiting Anesthetic complications: no   No notable events documented.  Last Vitals:  Vitals:   01/07/25 1530 01/07/25 1540  BP: 117/74 112/75  Pulse: 84 82  Resp: 20 (!) 23  Temp:  36.7 C  SpO2: 95% 97%    Last Pain:  Vitals:   01/07/25 1540  TempSrc:   PainSc: 0-No pain   Pain Goal:                   Ferlando Lia L Nickole Adamek      "

## 2025-01-09 NOTE — Addendum Note (Signed)
 Addendum  created 01/09/25 1016 by Niels Marien CROME, MD   Clinical Note Signed, Intraprocedure Blocks edited

## 2025-01-12 DIAGNOSIS — S43003A Unspecified subluxation of unspecified shoulder joint, initial encounter: Secondary | ICD-10-CM

## 2025-01-12 DIAGNOSIS — S43431A Superior glenoid labrum lesion of right shoulder, initial encounter: Secondary | ICD-10-CM

## 2025-01-12 DIAGNOSIS — M75121 Complete rotator cuff tear or rupture of right shoulder, not specified as traumatic: Secondary | ICD-10-CM

## 2025-01-12 DIAGNOSIS — M65911 Unspecified synovitis and tenosynovitis, right shoulder: Secondary | ICD-10-CM

## 2025-01-15 ENCOUNTER — Ambulatory Visit: Admitting: Orthopedic Surgery

## 2025-01-15 ENCOUNTER — Encounter: Admitting: Orthopedic Surgery

## 2025-01-15 ENCOUNTER — Encounter: Payer: Self-pay | Admitting: Orthopedic Surgery

## 2025-01-15 DIAGNOSIS — M12811 Other specific arthropathies, not elsewhere classified, right shoulder: Secondary | ICD-10-CM

## 2025-01-15 DIAGNOSIS — M75101 Unspecified rotator cuff tear or rupture of right shoulder, not specified as traumatic: Secondary | ICD-10-CM

## 2025-01-15 NOTE — Progress Notes (Signed)
 "  Post-Op Visit Note   Patient: Nathan [name]           Date of Birth: 1976/02/19           MRN: [id number] Visit Date: 01/15/2025 PCP: Gareth Mliss FALCON, FNP   Assessment & Plan:  Chief Complaint:  Chief Complaint  Patient presents with   Right Shoulder - Pain    01/07/25- Right Shoulder Arthroscopy, Debridement, Bicep Tenodesis, Rotator Cuff Tear Repair - Right     Visit Diagnoses:  1. Rotator cuff tear arthropathy of right shoulder     Plan: Fines is now a week out from a shoulder arthroscopy with biceps tenodesis and new to [place] subscap and supraspinatus rotator cuff tendon repair.  He has been in a sling.  Doing pendulums about once a day.  On exam he is get good deltoid strength.  Subscap strength is actually 5 out of 5 external rotation strength also 5 out of 5.  Has pretty reasonable passive range of motion with the shoulder not being very stiff.  He is not taking oxycodone  but only Tylenol  and Naprosyn .  No coarseness or popping with passive range of motion.  Plan at this time is to keep him in a sling for 3 more weeks.  Portal sutures discontinued.  Do not really want him doing anything for the next 3 weeks other than pendulums once a day.  We can start him in physical therapy for passive range of motion and active assisted range of motion in 3 weeks time.  Follow-Up Instructions: No follow-ups on file.   Orders:  No orders of the defined types were placed in this encounter.  No orders of the defined types were placed in this encounter.   Imaging: No results found.  PMFS History: Patient Active Problem List   Diagnosis Date Noted   Degenerative superior labral anterior-to-posterior (SLAP) tear of right shoulder 01/12/2025   Nontraumatic complete tear of right rotator cuff 01/12/2025   Synovitis of right shoulder 01/12/2025   Subluxation of tendon of long head of biceps 01/12/2025   Acute pain of right shoulder 05/03/2024   Fall 05/03/2024   Abrasions of multiple  sites 05/03/2024   Shoulder strain, right, initial encounter 05/03/2024   Anxiety 10/13/2023   Mixed hyperlipidemia 06/03/2022   Lumbar radiculopathy 06/03/2022   Bulging lumbar disc 07/29/2021   History of colonic polyps    Elevated BP without diagnosis of hypertension 10/28/2020   Vitamin D  deficiency 01/15/2019   Bone spur of foot 03/16/2018   Hemorrhoid 03/16/2018   Family history of colon cancer    GERD (gastroesophageal reflux disease) 09/05/2017   Bilateral elbow joint pain 11/23/2016   Obstructive sleep apnea on CPAP 11/12/2015   Morbid obesity (HCC) 11/12/2015   Syncope and collapse 09/24/2015   Snoring 09/24/2015   Abnormal EKG 09/24/2015   Past Medical History:  Diagnosis Date   GERD (gastroesophageal reflux disease)    OSA (obstructive sleep apnea)     Family History  Problem Relation Age of Onset   Cancer Mother    Stroke Father    Heart disease Father    Diabetes Father     Past Surgical History:  Procedure Laterality Date   COLONOSCOPY WITH PROPOFOL  N/A 10/20/2017   Procedure: COLONOSCOPY WITH PROPOFOL ;  Surgeon: Jinny Carmine, MD;  Location: [facility];  Service: Endoscopy;  Laterality: N/A;   COLONOSCOPY WITH PROPOFOL  N/A 11/16/2020   Procedure: COLONOSCOPY WITH BIOPSY;  Surgeon: Jinny Carmine, MD;  Location:  [facility];  Service: Endoscopy;  Laterality: N/A;  Sleep apnea priority 4   ESOPHAGOGASTRODUODENOSCOPY (EGD) WITH PROPOFOL  N/A 10/20/2017   Procedure: ESOPHAGOGASTRODUODENOSCOPY (EGD) WITH PROPOFOL ;  Surgeon: Jinny Carmine, MD;  Location: [facility];  Service: Endoscopy;  Laterality: N/A;  sleep apnea   GALLBLADDER SURGERY     POLYPECTOMY N/A 11/16/2020   Procedure: POLYPECTOMY;  Surgeon: Jinny Carmine, MD;  Location: [facility];  Service: Endoscopy;  Laterality: N/A;   Social History   Occupational History   Not on file  Tobacco Use   Smoking status: Former    Current packs/day: 0.00    Average packs/day: 1  pack/day for 16.0 years (16.0 ttl pk-yrs)    Types: Cigarettes    Start date: 79    Quit date: 2008    Years since quitting: 18.0   Smokeless tobacco: Current    Types: Snuff, Chew  Vaping Use   Vaping status: Never Used  Substance and Sexual Activity   Alcohol use: Yes    Comment: rare   Drug use: No   Sexual activity: Not Currently     "

## 2025-01-18 ENCOUNTER — Other Ambulatory Visit: Payer: Self-pay | Admitting: Nurse Practitioner

## 2025-01-20 NOTE — Telephone Encounter (Signed)
 Requested medication (s) are due for refill today: routing fr review  Requested medication (s) are on the active medication list: no  Last refill:  01/01/25  Future visit scheduled: no  Notes to clinic:  historical provider, last refilled by ED provider.     Requested Prescriptions  Pending Prescriptions Disp Refills   naproxen  (NAPROSYN ) 500 MG tablet [Pharmacy Med Name: NAPROXEN  500 MG TABLET] 180 tablet     Sig: TAKE 1 TABLET BY MOUTH TWICE A DAY WITH FOOD     Analgesics:  NSAIDS Failed - 01/20/2025 12:39 PM      Failed - Manual Review: Labs are only required if the patient has taken medication for more than 8 weeks.      Passed - Cr in normal range and within 360 days    Creat  Date Value Ref Range Status  10/13/2023 0.95 0.60 - 1.29 mg/dL Final   Creatinine, Ser  Date Value Ref Range Status  01/03/2025 0.93 0.61 - 1.24 mg/dL Final         Passed - HGB in normal range and within 360 days    Hemoglobin  Date Value Ref Range Status  01/03/2025 16.5 13.0 - 17.0 g/dL Final         Passed - PLT in normal range and within 360 days    Platelets  Date Value Ref Range Status  01/03/2025 289 150 - 400 K/uL Final         Passed - HCT in normal range and within 360 days    HCT  Date Value Ref Range Status  01/03/2025 48.2 39.0 - 52.0 % Final         Passed - eGFR is 30 or above and within 360 days    GFR, Est African American  Date Value Ref Range Status  11/12/2020 115 > OR = 60 mL/min/1.1m2 Final   GFR, Est Non African American  Date Value Ref Range Status  11/12/2020 99 > OR = 60 mL/min/1.32m2 Final   GFR, Estimated  Date Value Ref Range Status  01/03/2025 >60 >60 mL/min Final    Comment:    (NOTE) Calculated using the CKD-EPI Creatinine Equation (2021)    eGFR  Date Value Ref Range Status  10/13/2023 98 > OR = 60 mL/min/1.62m2 Final         Passed - Patient is not pregnant      Passed - Valid encounter within last 12 months    Recent Outpatient  Visits           2 months ago Acute suppurative otitis media of left ear without spontaneous rupture of tympanic membrane, recurrence not specified   [facility] Franchot Isaiah LABOR, [place]   8 months ago Obstructive sleep apnea on CPAP   [facility] Gareth Mliss FAL[place], [place]

## 2025-01-24 ENCOUNTER — Ambulatory Visit: Admitting: Orthopedic Surgery

## 2025-02-07 ENCOUNTER — Encounter: Admitting: Orthopedic Surgery
